# Patient Record
Sex: Female | Born: 1935 | Race: Black or African American | Hispanic: No | Smoking: Former smoker
Health system: Southern US, Community
[De-identification: ages and names within clinical notes are randomized; demographics above are authoritative.]

## PROBLEM LIST (undated history)

## (undated) DIAGNOSIS — G8191 Hemiplegia, unspecified affecting right dominant side: Secondary | ICD-10-CM

## (undated) DIAGNOSIS — Z993 Dependence on wheelchair: Secondary | ICD-10-CM

## (undated) DIAGNOSIS — I2510 Atherosclerotic heart disease of native coronary artery without angina pectoris: Secondary | ICD-10-CM

## (undated) DIAGNOSIS — I10 Essential (primary) hypertension: Secondary | ICD-10-CM

## (undated) DIAGNOSIS — M549 Dorsalgia, unspecified: Secondary | ICD-10-CM

## (undated) DIAGNOSIS — M1990 Unspecified osteoarthritis, unspecified site: Secondary | ICD-10-CM

## (undated) HISTORY — PX: ABDOMINAL HYSTERECTOMY: SHX81

## (undated) HISTORY — PX: CARDIAC SURGERY: SHX584

---

## 2017-09-17 ENCOUNTER — Emergency Department (HOSPITAL_COMMUNITY): Payer: Medicare Other

## 2017-09-17 ENCOUNTER — Encounter (HOSPITAL_COMMUNITY): Payer: Self-pay | Admitting: Emergency Medicine

## 2017-09-17 ENCOUNTER — Inpatient Hospital Stay (HOSPITAL_COMMUNITY)
Admission: EM | Admit: 2017-09-17 | Discharge: 2017-09-20 | DRG: 193 | Disposition: A | Discharge status: Home / Self Care | Payer: Medicare Other | Attending: Internal Medicine | Admitting: Internal Medicine

## 2017-09-17 ENCOUNTER — Other Ambulatory Visit: Payer: Self-pay

## 2017-09-17 DIAGNOSIS — G9340 Encephalopathy, unspecified: Secondary | ICD-10-CM

## 2017-09-17 DIAGNOSIS — T402X1A Poisoning by other opioids, accidental (unintentional), initial encounter: Secondary | ICD-10-CM | POA: Diagnosis not present

## 2017-09-17 DIAGNOSIS — Z8249 Family history of ischemic heart disease and other diseases of the circulatory system: Secondary | ICD-10-CM

## 2017-09-17 DIAGNOSIS — E872 Acidosis: Secondary | ICD-10-CM | POA: Diagnosis present

## 2017-09-17 DIAGNOSIS — G9341 Metabolic encephalopathy: Secondary | ICD-10-CM | POA: Diagnosis present

## 2017-09-17 DIAGNOSIS — Z79891 Long term (current) use of opiate analgesic: Secondary | ICD-10-CM | POA: Diagnosis not present

## 2017-09-17 DIAGNOSIS — N179 Acute kidney failure, unspecified: Secondary | ICD-10-CM

## 2017-09-17 DIAGNOSIS — I129 Hypertensive chronic kidney disease with stage 1 through stage 4 chronic kidney disease, or unspecified chronic kidney disease: Secondary | ICD-10-CM | POA: Diagnosis present

## 2017-09-17 DIAGNOSIS — M4800 Spinal stenosis, site unspecified: Secondary | ICD-10-CM | POA: Diagnosis present

## 2017-09-17 DIAGNOSIS — I2510 Atherosclerotic heart disease of native coronary artery without angina pectoris: Secondary | ICD-10-CM | POA: Diagnosis present

## 2017-09-17 DIAGNOSIS — E860 Dehydration: Secondary | ICD-10-CM | POA: Diagnosis present

## 2017-09-17 DIAGNOSIS — E875 Hyperkalemia: Secondary | ICD-10-CM | POA: Diagnosis present

## 2017-09-17 DIAGNOSIS — J188 Other pneumonia, unspecified organism: Secondary | ICD-10-CM

## 2017-09-17 DIAGNOSIS — E871 Hypo-osmolality and hyponatremia: Secondary | ICD-10-CM | POA: Diagnosis present

## 2017-09-17 DIAGNOSIS — J189 Pneumonia, unspecified organism: Secondary | ICD-10-CM | POA: Diagnosis not present

## 2017-09-17 DIAGNOSIS — Z955 Presence of coronary angioplasty implant and graft: Secondary | ICD-10-CM

## 2017-09-17 DIAGNOSIS — N183 Chronic kidney disease, stage 3 (moderate): Secondary | ICD-10-CM | POA: Diagnosis present

## 2017-09-17 DIAGNOSIS — J181 Lobar pneumonia, unspecified organism: Principal | ICD-10-CM | POA: Diagnosis present

## 2017-09-17 DIAGNOSIS — Z87891 Personal history of nicotine dependence: Secondary | ICD-10-CM

## 2017-09-17 DIAGNOSIS — R0902 Hypoxemia: Secondary | ICD-10-CM | POA: Diagnosis present

## 2017-09-17 DIAGNOSIS — Z993 Dependence on wheelchair: Secondary | ICD-10-CM

## 2017-09-17 HISTORY — DX: Atherosclerotic heart disease of native coronary artery without angina pectoris: I25.10

## 2017-09-17 HISTORY — DX: Essential (primary) hypertension: I10

## 2017-09-17 HISTORY — DX: Unspecified osteoarthritis, unspecified site: M19.90

## 2017-09-17 HISTORY — DX: Dorsalgia, unspecified: M54.9

## 2017-09-17 LAB — CBC
HCT: 35.2 % — ABNORMAL LOW (ref 36.0–46.0)
Hemoglobin: 11.5 g/dL — ABNORMAL LOW (ref 12.0–15.0)
MCH: 27.6 pg (ref 26.0–34.0)
MCHC: 32.7 g/dL (ref 30.0–36.0)
MCV: 84.6 fL (ref 78.0–100.0)
Platelets: 159 10*3/uL (ref 150–400)
RBC: 4.16 MIL/uL (ref 3.87–5.11)
RDW: 15.3 % (ref 11.5–15.5)
WBC: 11.4 10*3/uL — ABNORMAL HIGH (ref 4.0–10.5)

## 2017-09-17 LAB — COMPREHENSIVE METABOLIC PANEL
ALK PHOS: 101 U/L (ref 38–126)
ALT: 8 U/L — AB (ref 14–54)
AST: 28 U/L (ref 15–41)
Albumin: 3.2 g/dL — ABNORMAL LOW (ref 3.5–5.0)
Anion gap: 9 (ref 5–15)
BILIRUBIN TOTAL: 0.6 mg/dL (ref 0.3–1.2)
BUN: 27 mg/dL — AB (ref 6–20)
CALCIUM: 8.3 mg/dL — AB (ref 8.9–10.3)
CO2: 22 mmol/L (ref 22–32)
CREATININE: 2.05 mg/dL — AB (ref 0.44–1.00)
Chloride: 95 mmol/L — ABNORMAL LOW (ref 101–111)
GFR calc Af Amer: 25 mL/min — ABNORMAL LOW (ref >60)
GFR calc non Af Amer: 21 mL/min — ABNORMAL LOW (ref >60)
GLUCOSE: 105 mg/dL — AB (ref 65–99)
Potassium: 5.5 mmol/L — ABNORMAL HIGH (ref 3.5–5.1)
Sodium: 126 mmol/L — ABNORMAL LOW (ref 135–145)
TOTAL PROTEIN: 7.1 g/dL (ref 6.5–8.1)

## 2017-09-17 LAB — BASIC METABOLIC PANEL
Anion gap: 14 (ref 5–15)
BUN: 28 mg/dL — AB (ref 6–20)
CO2: 20 mmol/L — AB (ref 22–32)
Calcium: 8.6 mg/dL — ABNORMAL LOW (ref 8.9–10.3)
Chloride: 93 mmol/L — ABNORMAL LOW (ref 101–111)
Creatinine, Ser: 2.03 mg/dL — ABNORMAL HIGH (ref 0.44–1.00)
GFR calc Af Amer: 25 mL/min — ABNORMAL LOW (ref >60)
GFR, EST NON AFRICAN AMERICAN: 22 mL/min — AB (ref >60)
GLUCOSE: 101 mg/dL — AB (ref 65–99)
POTASSIUM: 5.3 mmol/L — AB (ref 3.5–5.1)
Sodium: 127 mmol/L — ABNORMAL LOW (ref 135–145)

## 2017-09-17 LAB — I-STAT CHEM 8, ED
BUN: 36 mg/dL — AB (ref 6–20)
CREATININE: 2.2 mg/dL — AB (ref 0.44–1.00)
Calcium, Ion: 1.05 mmol/L — ABNORMAL LOW (ref 1.15–1.40)
Chloride: 97 mmol/L — ABNORMAL LOW (ref 101–111)
GLUCOSE: 101 mg/dL — AB (ref 65–99)
HCT: 37 % (ref 36.0–46.0)
Hemoglobin: 12.6 g/dL (ref 12.0–15.0)
Potassium: 5.5 mmol/L — ABNORMAL HIGH (ref 3.5–5.1)
Sodium: 128 mmol/L — ABNORMAL LOW (ref 135–145)
TCO2: 24 mmol/L (ref 22–32)

## 2017-09-17 LAB — I-STAT TROPONIN, ED: Troponin i, poc: 0 ng/mL (ref 0.00–0.08)

## 2017-09-17 LAB — CBG MONITORING, ED: Glucose-Capillary: 94 mg/dL (ref 65–99)

## 2017-09-17 LAB — ETHANOL

## 2017-09-17 MED ORDER — ENOXAPARIN SODIUM 30 MG/0.3ML ~~LOC~~ SOLN
30.0000 mg | SUBCUTANEOUS | Status: DC
  Administered 2017-09-18 – 2017-09-19 (×2): 30 mg via SUBCUTANEOUS
  Filled 2017-09-17 (×3): qty 0.3

## 2017-09-17 MED ORDER — SODIUM CHLORIDE 0.9 % IV SOLN
500.0000 mg | INTRAVENOUS | Status: DC
  Administered 2017-09-19 – 2017-09-20 (×2): 500 mg via INTRAVENOUS
  Filled 2017-09-17 (×2): qty 500

## 2017-09-17 MED ORDER — SODIUM CHLORIDE 0.9 % IV SOLN
2.0000 g | Freq: Once | INTRAVENOUS | Status: AC
  Administered 2017-09-18: 2 g via INTRAVENOUS
  Filled 2017-09-17: qty 20

## 2017-09-17 MED ORDER — SODIUM CHLORIDE 0.9 % IV SOLN
Freq: Once | INTRAVENOUS | Status: AC
  Administered 2017-09-17: 23:00:00 via INTRAVENOUS

## 2017-09-17 MED ORDER — SODIUM CHLORIDE 0.9 % IV SOLN
1.0000 g | INTRAVENOUS | Status: DC
  Administered 2017-09-18 – 2017-09-20 (×2): 1 g via INTRAVENOUS
  Filled 2017-09-17 (×3): qty 10

## 2017-09-17 MED ORDER — SODIUM CHLORIDE 0.9 % IV SOLN
INTRAVENOUS | Status: AC
  Administered 2017-09-18: via INTRAVENOUS

## 2017-09-17 MED ORDER — NALOXONE HCL 0.4 MG/ML IJ SOLN
0.4000 mg | Freq: Once | INTRAMUSCULAR | Status: AC
  Administered 2017-09-17: 0.4 mg via INTRAVENOUS
  Filled 2017-09-17: qty 1

## 2017-09-17 MED ORDER — AZITHROMYCIN 500 MG IV SOLR
500.0000 mg | Freq: Once | INTRAVENOUS | Status: AC
  Administered 2017-09-18: 500 mg via INTRAVENOUS
  Filled 2017-09-17: qty 500

## 2017-09-17 NOTE — ED Notes (Signed)
Positive response to narcan.  EDP at bedside speaking to daughter and son on the phone.

## 2017-09-17 NOTE — ED Triage Notes (Signed)
Pt arrives via [facility] reporting lethargy and generalized weakness worsening since yesterday.  Pt drove up yesterday from [place], [place], approx 3-4 hr drive.  Pt denies pain, family reports no complaints.  Pt wearing fentanyl patch on arrival.  Pt's family reports pt on same dose for years.  Resp e/u. CBG on arrival [id number].

## 2017-09-17 NOTE — H&P (Addendum)
TRH H&P   Patient Demographics:    Lahoma RockerLillie Stoltzfus, is a 84 y.o. female  MRN: [id number]   DOB - 08-02-1932  Admit Date - 09/17/2017  Outpatient Primary MD for the patient is Patient, No Pcp Per  Referring MD/NP/PA: Reinaldo Meekerameron Issacs  Outpatient Specialists:     Patient coming from: home  Chief Complaint  Patient presents with  . Altered Mental Status  . Weakness      HPI:    Lahoma RockerLillie Jaquith  is a 84 y.o. female, w hypertension, CAD s/p stent presents with c/o AMS,  Starting yesterday evening.  Persisted today.  Pt denies fever, chills, cough, cp, palp, sob, n/v, diarrhea.   Pt was brought in for evaluation by her family.    In ED , CXR IMPRESSION: 1. Multifocal ground-glass opacity in the right upper lobe and left base suspicious for multifocal pneumonia 2. Asymmetric right hilar opacity, cannot exclude mass. Recommend contrast-enhanced CT to further evaluate 3. Tiny pleural effusions.   CT brain IMPRESSION: 1. No CT evidence for acute intracranial abnormality. 2. Small vessel ischemic changes of the white matter.  Na 127, K 5.3, Bun 28, Creatinine 2.03,  Wbc 11.4, Hgb 11.5 Plt 159 Trop 0.00  Pt will be admitted for AMS secondary to pneumonia, ARF, and hyperkalemia.    Review of systems:    In addition to the HPI above,  No Fever-chills, No Headache, No changes with Vision or hearing, No problems swallowing food or Liquids, No Chest pain, Cough or Shortness of Breath, No Abdominal pain, No Nausea or Vommitting, Bowel movements are regular, No Blood in stool or Urine, No dysuria, No new skin rashes or bruises, No new joints pains-aches,  No new weakness, tingling, numbness in any extremity, No recent weight gain or loss, No polyuria, polydypsia or polyphagia, No significant Mental Stressors.  A full 10 point Review of Systems was done, except as stated  above, all other Review of Systems were negative.   With Past History of the following :    Past Medical History:  Diagnosis Date  . Arthritis   . Coronary artery disease   . Hypertension       Past Surgical History:  Procedure Laterality Date  . ABDOMINAL HYSTERECTOMY    . CARDIAC SURGERY     stent placement      Social History:     Social History   Tobacco Use  . Smoking status: Former Games developermoker  . Smokeless tobacco: Never Used  Substance Use Topics  . Alcohol use: Never    Frequency: Never     Lives - at home  Mobility - walks by self   Family History :     Family History  Problem Relation Age of Onset  . Hypertension Mother       Home Medications:   Prior to Admission medications   Not on File  Allergies:    No Known Allergies   Physical Exam:   Vitals  Blood pressure 104/78, pulse 71, temperature 98.8 F (37.1 C), temperature source Oral, resp. rate (!) 8, height 5\' 4"  (1.626 m), weight 74.8 kg (165 lb), SpO2 95 %.   1. General  lying in bed in NAD,    2. Normal affect and insight, Not Suicidal or Homicidal, Awake Alert, Oriented X 3.  3. No F.N deficits, ALL C.Nerves Intact, Strength 5/5 all 4 extremities, Sensation intact all 4 extremities, Plantars down going.  4. Ears and Eyes appear Normal, Conjunctivae clear, PERRLA. Moist Oral Mucosa.  5. Supple Neck, No JVD, No cervical lymphadenopathy appriciated, No Carotid Bruits.  6. Symmetrical Chest wall movement, Good air movement bilaterally, slight crackles bilateral base, no wheezing.   7. RRR, No Gallops, Rubs or Murmurs, No Parasternal Heave.  8. Positive Bowel Sounds, Abdomen Soft, No tenderness, No organomegaly appriciated,No rebound -guarding or rigidity.  9.  No Cyanosis, Normal Skin Turgor, No Skin Rash or Bruise.  10. Good muscle tone,  joints appear normal , no effusions, Normal ROM.  11. No Palpable Lymph Nodes in Neck or Axillae     Data Review:    CBC Recent  Labs  Lab 09/17/17 2104 09/17/17 2118  WBC 11.4*  --   HGB 11.5* 12.6  HCT 35.2* 37.0  PLT 159  --   MCV 84.6  --   MCH 27.6  --   MCHC 32.7  --   RDW 15.3  --    ------------------------------------------------------------------------------------------------------------------  Chemistries  Recent Labs  Lab 09/17/17 2104 09/17/17 2118 09/17/17 2225  NA 126* 128* 127*  K 5.5* 5.5* 5.3*  CL 95* 97* 93*  CO2 22  --  20*  GLUCOSE 105* 101* 101*  BUN 27* 36* 28*  CREATININE 2.05* 2.20* 2.03*  CALCIUM 8.3*  --  8.6*  AST 28  --   --   ALT 8*  --   --   ALKPHOS 101  --   --   BILITOT 0.6  --   --    ------------------------------------------------------------------------------------------------------------------ estimated creatinine clearance is 21.1 mL/min (A) (by C-G formula based on SCr of 2.03 mg/dL (H)). ------------------------------------------------------------------------------------------------------------------ No results for input(s): TSH, T4TOTAL, T3FREE, THYROIDAB in the last 72 hours.  Invalid input(s): FREET3  Coagulation profile No results for input(s): INR, PROTIME in the last 168 hours. ------------------------------------------------------------------------------------------------------------------- No results for input(s): DDIMER in the last 72 hours. -------------------------------------------------------------------------------------------------------------------  Cardiac Enzymes No results for input(s): CKMB, TROPONINI, MYOGLOBIN in the last 168 hours.  Invalid input(s): CK ------------------------------------------------------------------------------------------------------------------ No results found for: BNP   ---------------------------------------------------------------------------------------------------------------  Urinalysis No results found for: COLORURINE, APPEARANCEUR, LABSPEC, PHURINE, GLUCOSEU, HGBUR, BILIRUBINUR, KETONESUR,  PROTEINUR, UROBILINOGEN, NITRITE, LEUKOCYTESUR  ----------------------------------------------------------------------------------------------------------------   Imaging Results:    Dg Chest 2 View  Result Date: 09/17/2017 CLINICAL DATA:  Confusion EXAM: CHEST - 2 VIEW COMPARISON:  None. FINDINGS: Low lung volumes. Multifocal ground-glass opacity in the right upper lobe and left base suspicious for multifocal pneumonia. Tiny pleural effusions. Heart size within normal limits. Aortic atherosclerosis. Asymmetric right hilar opacity. IMPRESSION: 1. Multifocal ground-glass opacity in the right upper lobe and left base suspicious for multifocal pneumonia 2. Asymmetric right hilar opacity, cannot exclude mass. Recommend contrast-enhanced CT to further evaluate 3. Tiny pleural effusions. Electronically Signed   By: Jasmine Pang M.D.   On: 09/17/2017 22:18   Ct Head Wo Contrast  Result Date: 09/17/2017 CLINICAL DATA:  Altered LOC EXAM: CT HEAD WITHOUT  CONTRAST TECHNIQUE: Contiguous axial images were obtained from the base of the skull through the vertex without intravenous contrast. COMPARISON:  None. FINDINGS: Brain: No acute territorial infarction, hemorrhage or intracranial mass is visualized. Mild small vessel ischemic changes of the white matter. Ventricle size within normal limits. Vascular: No hyperdense vessels.  Carotid vascular calcification. Skull: Normal. Negative for fracture or focal lesion. Sinuses/Orbits: Mild mucosal thickening in the ethmoid sinuses. No acute orbital abnormality. Other: None IMPRESSION: 1. No CT evidence for acute intracranial abnormality. 2. Small vessel ischemic changes of the white matter. Electronically Signed   By: Jasmine Pang M.D.   On: 09/17/2017 22:33       Assessment & Plan:    Active Problems:   Community acquired pneumonia   CAP (community acquired pneumonia)    CAP  Blood culture x2 Urine legionella antigen Urine strep antigen Sputum gram stain  and culture Start on Rocephin 1gm iv qday, zithromax 500mg  iv qday Check cbc in am CT chest pending => please f/u on results  Hyponatremia Check serum osm, tsh, cortisol Check urine osm, urine sodium Hydrate with ns iv  Check cmp in am  Hyperkalemia Hydrate with ns iv Check cmp in am  ARF Check urine sodium, urine creatinine, urine eosinophils Check renal ultrasound Check cmp in am   DVT Prophylaxis Lovenox - SCDs  AM Labs Ordered, also please review Full Orders  Family Communication: Admission, patients condition and plan of care including tests being ordered have been discussed with the patient  who indicate understanding and agree with the plan and Code Status.  Code Status FULL CODE  Likely DC to  home  Condition GUARDED    Consults called:  none  Admission status: inpatient  Time spent in minutes : 45   Pearson Grippe M.D on 09/17/2017 at 11:34 PM  Between 7am to 7pm - Pager - [phone]   After 7pm go to [email or web address] - password [facility]  [facility] - Office  [phone]

## 2017-09-17 NOTE — ED Provider Notes (Signed)
[facility] EMERGENCY DEPARTMENT Provider Note   CSN: [id number] Arrival date & time: 09/17/17  2058     History   Chief Complaint Chief Complaint  Patient presents with  . Altered Mental Status  . Weakness    HPI Katherine Aleshire is a 84 y.o. female.  HPI   84 yo F with PMHx HTN, CAD, here with generalized weakness and AMS. History limited 2/2 AMS. Pt arrives with daughter. Pt and daughter currenlty are travelling through [place] - pt normally lives in [place]. She reportedly was well yesterday, slightly tired but this is not abnormal. They drove to [place] and rented a hotel. On awakening, pt was confused, drowsy, and had a hard time wakign up. She was "okay" x 2 hours then was falling asleep, drowsy, with slurred speech. She's had occasional coughing as well. She was altered throughout the day today so subsequently brought to the ED.   On arrival here, pt with slurred speech, fatigued. Level 5 caveat invoked as remainder of history, ROS, and physical exam limited due to patient's confusion.   Past Medical History:  Diagnosis Date  . Arthritis   . Coronary artery disease   . Hypertension     There are no active problems to display for this patient.   Past Surgical History:  Procedure Laterality Date  . ABDOMINAL HYSTERECTOMY    . CARDIAC SURGERY     stent placement     OB History   None      Home Medications    Prior to Admission medications   Not on File    Family History No family history on file.  Social History Social History   Tobacco Use  . Smoking status: Former Games developer  . Smokeless tobacco: Never Used  Substance Use Topics  . Alcohol use: Never    Frequency: Never  . Drug use: Never     Allergies   Patient has no known allergies.   Review of Systems Review of Systems  Unable to perform ROS: Mental status change  Constitutional: Positive for fatigue.     Physical Exam Updated Vital Signs BP 104/78   Pulse  71   Temp 98.8 F (37.1 C) (Oral)   Resp (!) 8   Ht 5\' 4"  (1.626 m)   Wt 74.8 kg (165 lb)   SpO2 95%   BMI 28.32 kg/m   Physical Exam  Constitutional: She appears well-developed and well-nourished. She appears lethargic. No distress.  HENT:  Head: Normocephalic and atraumatic.  Eyes: Conjunctivae are normal.  Pinpoint pupils bilaterally  Neck: Neck supple.  Cardiovascular: Normal rate, regular rhythm and normal heart sounds. Exam reveals no friction rub.  No murmur heard. Pulmonary/Chest: Effort normal. Bradypnea noted. No respiratory distress. She has no wheezes. She has rhonchi in the right middle field, the right lower field, the left middle field and the left lower field. She has no rales.  Abdominal: She exhibits no distension.  Musculoskeletal: She exhibits no edema.  Neurological: She appears lethargic. She exhibits normal muscle tone. GCS eye subscore is 4. GCS verbal subscore is 5. GCS motor subscore is 6.  Drowsy but awakens, follows commands. MAE with 5/5 strength. Speech slightly slurred.  Skin: Skin is warm. Capillary refill takes less than 2 seconds.  Psychiatric: She has a normal mood and affect.  Nursing note and vitals reviewed.    ED Treatments / Results  Labs (all labs ordered are listed, but only abnormal results are displayed) Labs Reviewed  COMPREHENSIVE METABOLIC PANEL - Abnormal; Notable for the following components:      Result Value   Sodium 126 (*)    Potassium 5.5 (*)    Chloride 95 (*)    Glucose, Bld 105 (*)    BUN 27 (*)    Creatinine, Ser 2.05 (*)    Calcium 8.3 (*)    Albumin 3.2 (*)    ALT 8 (*)    GFR calc non Af Amer 21 (*)    GFR calc Af Amer 25 (*)    All other components within normal limits  CBC - Abnormal; Notable for the following components:   WBC 11.4 (*)    Hemoglobin 11.5 (*)    HCT 35.2 (*)    All other components within normal limits  BASIC METABOLIC PANEL - Abnormal; Notable for the following components:   Sodium  127 (*)    Potassium 5.3 (*)    Chloride 93 (*)    CO2 20 (*)    Glucose, Bld 101 (*)    BUN 28 (*)    Creatinine, Ser 2.03 (*)    Calcium 8.6 (*)    GFR calc non Af Amer 22 (*)    GFR calc Af Amer 25 (*)    All other components within normal limits  I-STAT CHEM 8, ED - Abnormal; Notable for the following components:   Sodium 128 (*)    Potassium 5.5 (*)    Chloride 97 (*)    BUN 36 (*)    Creatinine, Ser 2.20 (*)    Glucose, Bld 101 (*)    Calcium, Ion 1.05 (*)    All other components within normal limits  CULTURE, BLOOD (ROUTINE X 2)  CULTURE, BLOOD (ROUTINE X 2)  ETHANOL  URINALYSIS, ROUTINE W REFLEX MICROSCOPIC  CBG MONITORING, ED  CBG MONITORING, ED  I-STAT TROPONIN, ED  I-STAT VENOUS BLOOD GAS, ED    EKG None  Radiology Dg Chest 2 View  Result Date: 09/17/2017 CLINICAL DATA:  Confusion EXAM: CHEST - 2 VIEW COMPARISON:  None. FINDINGS: Low lung volumes. Multifocal ground-glass opacity in the right upper lobe and left base suspicious for multifocal pneumonia. Tiny pleural effusions. Heart size within normal limits. Aortic atherosclerosis. Asymmetric right hilar opacity. IMPRESSION: 1. Multifocal ground-glass opacity in the right upper lobe and left base suspicious for multifocal pneumonia 2. Asymmetric right hilar opacity, cannot exclude mass. Recommend contrast-enhanced CT to further evaluate 3. Tiny pleural effusions. Electronically Signed   By: Jasmine Pang M.D.   On: 09/17/2017 22:18   Ct Head Wo Contrast  Result Date: 09/17/2017 CLINICAL DATA:  Altered LOC EXAM: CT HEAD WITHOUT CONTRAST TECHNIQUE: Contiguous axial images were obtained from the base of the skull through the vertex without intravenous contrast. COMPARISON:  None. FINDINGS: Brain: No acute territorial infarction, hemorrhage or intracranial mass is visualized. Mild small vessel ischemic changes of the white matter. Ventricle size within normal limits. Vascular: No hyperdense vessels.  Carotid vascular  calcification. Skull: Normal. Negative for fracture or focal lesion. Sinuses/Orbits: Mild mucosal thickening in the ethmoid sinuses. No acute orbital abnormality. Other: None IMPRESSION: 1. No CT evidence for acute intracranial abnormality. 2. Small vessel ischemic changes of the white matter. Electronically Signed   By: Jasmine Pang M.D.   On: 09/17/2017 22:33    Procedures Procedures (including critical care time)  Medications Ordered in ED Medications  cefTRIAXone (ROCEPHIN) 2 g in sodium chloride 0.9 % 100 mL IVPB (has no administration in time range)  azithromycin (ZITHROMAX) 500 mg in sodium chloride 0.9 % 250 mL IVPB (has no administration in time range)  naloxone [facility](NARCAN) injection 0.4 mg (0.4 mg Intravenous Given 09/17/17 2236)  0.9 %  sodium chloride infusion ( Intravenous New Bag/Given 09/17/17 2236)     Initial Impression / Assessment and Plan / ED Course  I have reviewed the triage vital signs and the nursing notes.  Pertinent labs & imaging results that were available during my care of the patient were reviewed by me and considered in my medical decision making (see chart for details).     84 yo F here with AMS, lethargy. On arrival, pt drowsy but aorusable, protecting her airway. Initial work-up shows multifocal PNA, likely AKI - unknown baseline but family denies any known h/o CKD. I suspect her drowsiness is multifactorial 2/2 PNA, also AKI with retention of her chronic opioids and subsequent opioid narcosis. Pt given dose of narcan with excellent response. Will plan to admit for encephalopathy 2/2 PNA, opioid use.  Final Clinical Impressions(s) / ED Diagnoses   Final diagnoses:  Multifocal pneumonia  Opioid overdose, accidental or unintentional, initial encounter [facility](HCC)  Encephalopathy    ED Discharge Orders    None       Shaune PollackIsaacs, Jaleigha Deane, MD 09/17/17 2259

## 2017-09-18 ENCOUNTER — Inpatient Hospital Stay (HOSPITAL_COMMUNITY): Payer: Medicare Other

## 2017-09-18 ENCOUNTER — Other Ambulatory Visit: Payer: Self-pay

## 2017-09-18 DIAGNOSIS — J181 Lobar pneumonia, unspecified organism: Principal | ICD-10-CM

## 2017-09-18 LAB — I-STAT ARTERIAL BLOOD GAS, ED
Acid-base deficit: 5 mmol/L — ABNORMAL HIGH (ref 0.0–2.0)
Bicarbonate: 21.1 mmol/L (ref 20.0–28.0)
O2 SAT: 97 %
PCO2 ART: 42.2 mmHg (ref 32.0–48.0)
PH ART: 7.308 — AB (ref 7.350–7.450)
TCO2: 22 mmol/L (ref 22–32)
pO2, Arterial: 97 mmHg (ref 83.0–108.0)

## 2017-09-18 LAB — TSH: TSH: 1.313 u[IU]/mL (ref 0.350–4.500)

## 2017-09-18 LAB — I-STAT VENOUS BLOOD GAS, ED
Acid-base deficit: 3 mmol/L — ABNORMAL HIGH (ref 0.0–2.0)
Bicarbonate: 24.5 mmol/L (ref 20.0–28.0)
O2 SAT: 24 %
PH VEN: 7.262 (ref 7.250–7.430)
PO2 VEN: 20 mmHg — AB (ref 32.0–45.0)
TCO2: 26 mmol/L (ref 22–32)
pCO2, Ven: 54.2 mmHg (ref 44.0–60.0)

## 2017-09-18 LAB — URINALYSIS, ROUTINE W REFLEX MICROSCOPIC
Bilirubin Urine: NEGATIVE
Glucose, UA: NEGATIVE mg/dL
Ketones, ur: NEGATIVE mg/dL
Nitrite: NEGATIVE
PROTEIN: NEGATIVE mg/dL
Specific Gravity, Urine: 1.006 (ref 1.005–1.030)
pH: 6 (ref 5.0–8.0)

## 2017-09-18 LAB — CORTISOL: Cortisol, Plasma: 26.7 ug/dL

## 2017-09-18 LAB — OSMOLALITY: OSMOLALITY: 281 mosm/kg (ref 275–295)

## 2017-09-18 LAB — HIV ANTIBODY (ROUTINE TESTING W REFLEX): HIV Screen 4th Generation wRfx: NONREACTIVE

## 2017-09-18 LAB — OSMOLALITY, URINE: Osmolality, Ur: 225 mOsm/kg — ABNORMAL LOW (ref 300–900)

## 2017-09-18 LAB — STREP PNEUMONIAE URINARY ANTIGEN: Strep Pneumo Urinary Antigen: NEGATIVE

## 2017-09-18 LAB — SODIUM, URINE, RANDOM: SODIUM UR: 52 mmol/L

## 2017-09-18 NOTE — Progress Notes (Signed)
PROGRESS NOTE  Katherine Burns JXB:[id number] DOB: Nov 08, 1932 DOA: 09/17/2017 PCP: Patient, No Pcp Per  HPI/Recap of past 24 hours: Katherine Burns  is a 84 y.o. female, w hypertension, CAD s/p stent presents with c/o AMS,  Starting yesterday evening.  Persisted today.  Pt denies fever, chills, cough, cp, palp, sob, n/v, diarrhea.   Pt was brought in for evaluation by her family.    09/18/2017: Patient seen and examined at her bedside. She reports productive cough for more than a week.  He denies dyspnea at rest or chest pain.   Assessment/Plan:Active Problems:   Community acquired pneumonia   Multifocal pneumonia  Multifocal community-acquired pneumonia with suspected aspiration, not improved Personally reviewed chest x-ray done on 09/17/2017 bilateral patchy infiltrates noted. Continue IV ceftriaxone and IV azithromycin day #1 On CT chest with no contrast small volume of retained or aspirated secretions in the trachea at the thoracic inlet Continue O2 supplementation to maintain O2 saturation 92% or greater Continue continuous pulse oximetry Continue nebs We will obtain swallow evaluation to rule out dysphagia Closely monitor vital signs Aspiration precaution Blood cultures in process  Suspected dysphagia On CT chest with no contrast done on 09/17/2017 noted small volume of retained or aspirated secretions in the trachea at the thoracic inlet. Speech therapist to evaluate for safety swallowing  Acute metabolic encephalopathy, resolved Most likely secondary to pneumonia versus hyponatremia CT head negative for intracranial findings Reorient as needed  Hyponatremia Sodium 127 potassium 5.3 Repeat BMP in the morning If persistent may consider consulting nephrology  Anion gap metabolic acidosis Bicarb 20 anion gap 14 Unclear etiology  CKD stage III No baseline creatinine to compare with Creatinine 2.03 from 2.20 Avoid nephrotoxic agents/hypotension Repeat BMP in the  morning.   Code Status: Full code  Family Communication: None at bedside  Disposition Plan: SNF versus home when clinically stable     Consultants: None  Procedures:  None  Antimicrobials:  IV azithromycin and IV ceftriaxone  DVT prophylaxis:  SCDs   Objective: Vitals:   09/18/17 0915 09/18/17 1007 09/18/17 1131 09/18/17 1521  BP: 103/64  (!) 143/83 138/77  Pulse: 77 74 80 85  Resp: (!) 24 14 16 18   Temp:   98.9 F (37.2 C) 99.5 F (37.5 C)  TempSrc:   Oral Oral  SpO2: 100% 100% 96% (!) 87%  Weight:   73.5 kg (162 lb)   Height:   5\' 4"  (1.626 m)     Intake/Output Summary (Last 24 hours) at 09/18/2017 1614 Last data filed at 09/18/2017 1458 Gross per 24 hour  Intake 1147.5 ml  Output -  Net 1147.5 ml   Filed Weights   09/17/17 2108 09/18/17 1131  Weight: 74.8 kg (165 lb) 73.5 kg (162 lb)    Exam:   General: 84 year old African American female well-developed well-nourished no acute distress.  Alert and oriented x3.  Cardiovascular: Regular rate and rhythm with no rubs or gallops.  Respiratory: Mild rales at bases.  No wheezes   abdomen: Soft nontender nondistended normal bowel sounds x4  Musculoskeletal: No motor focal deficits  Skin: No rash noted  Psychiatry: Mood is appropriate for condition and setting   Data Reviewed: CBC: Recent Labs  Lab 09/17/17 2104 09/17/17 2118  WBC 11.4*  --   HGB 11.5* 12.6  HCT 35.2* 37.0  MCV 84.6  --   PLT 159  --    Basic Metabolic Panel: Recent Labs  Lab 09/17/17 2104 09/17/17 2118 09/17/17 2225  NA  126* 128* 127*  K 5.5* 5.5* 5.3*  CL 95* 97* 93*  CO2 22  --  20*  GLUCOSE 105* 101* 101*  BUN 27* 36* 28*  CREATININE 2.05* 2.20* 2.03*  CALCIUM 8.3*  --  8.6*   GFR: Estimated Creatinine Clearance: 21 mL/min (A) (by C-G formula based on SCr of 2.03 mg/dL (H)). Liver Function Tests: Recent Labs  Lab 09/17/17 2104  AST 28  ALT 8*  ALKPHOS 101  BILITOT 0.6  PROT 7.1  ALBUMIN 3.2*    No results for input(s): LIPASE, AMYLASE in the last 168 hours. No results for input(s): AMMONIA in the last 168 hours. Coagulation Profile: No results for input(s): INR, PROTIME in the last 168 hours. Cardiac Enzymes: No results for input(s): CKTOTAL, CKMB, CKMBINDEX, TROPONINI in the last 168 hours. BNP (last 3 results) No results for input(s): PROBNP in the last 8760 hours. HbA1C: No results for input(s): HGBA1C in the last 72 hours. CBG: Recent Labs  Lab 09/17/17 2103  GLUCAP 94   Lipid Profile: No results for input(s): CHOL, HDL, LDLCALC, TRIG, CHOLHDL, LDLDIRECT in the last 72 hours. Thyroid Function Tests: Recent Labs    09/18/17 0003  TSH 1.313   Anemia Panel: No results for input(s): VITAMINB12, FOLATE, FERRITIN, TIBC, IRON, RETICCTPCT in the last 72 hours. Urine analysis:    Component Value Date/Time   COLORURINE YELLOW 09/17/2017 2337   APPEARANCEUR HAZY (A) 09/17/2017 2337   LABSPEC 1.006 09/17/2017 2337   PHURINE 6.0 09/17/2017 2337   GLUCOSEU NEGATIVE 09/17/2017 2337   HGBUR SMALL (A) 09/17/2017 2337   BILIRUBINUR NEGATIVE 09/17/2017 2337   KETONESUR NEGATIVE 09/17/2017 2337   PROTEINUR NEGATIVE 09/17/2017 2337   NITRITE NEGATIVE 09/17/2017 2337   LEUKOCYTESUR MODERATE (A) 09/17/2017 2337   Sepsis Labs: @LABRCNTIP (procalcitonin:4,lacticidven:4)  )No results found for this or any previous visit (from the past 240 hour(s)).    Studies: Dg Chest 2 View  Result Date: 09/17/2017 CLINICAL DATA:  Confusion EXAM: CHEST - 2 VIEW COMPARISON:  None. FINDINGS: Low lung volumes. Multifocal ground-glass opacity in the right upper lobe and left base suspicious for multifocal pneumonia. Tiny pleural effusions. Heart size within normal limits. Aortic atherosclerosis. Asymmetric right hilar opacity. IMPRESSION: 1. Multifocal ground-glass opacity in the right upper lobe and left base suspicious for multifocal pneumonia 2. Asymmetric right hilar opacity, cannot exclude  mass. Recommend contrast-enhanced CT to further evaluate 3. Tiny pleural effusions. Electronically Signed   By: Jasmine Pang M.D.   On: 09/17/2017 22:18   Ct Head Wo Contrast  Result Date: 09/17/2017 CLINICAL DATA:  Altered LOC EXAM: CT HEAD WITHOUT CONTRAST TECHNIQUE: Contiguous axial images were obtained from the base of the skull through the vertex without intravenous contrast. COMPARISON:  None. FINDINGS: Brain: No acute territorial infarction, hemorrhage or intracranial mass is visualized. Mild small vessel ischemic changes of the white matter. Ventricle size within normal limits. Vascular: No hyperdense vessels.  Carotid vascular calcification. Skull: Normal. Negative for fracture or focal lesion. Sinuses/Orbits: Mild mucosal thickening in the ethmoid sinuses. No acute orbital abnormality. Other: None IMPRESSION: 1. No CT evidence for acute intracranial abnormality. 2. Small vessel ischemic changes of the white matter. Electronically Signed   By: Jasmine Pang M.D.   On: 09/17/2017 22:33   Ct Chest Wo Contrast  Result Date: 09/17/2017 CLINICAL DATA:  84 year old female with weakness and lethargy. Progressive symptoms since yesterday. EXAM: CT CHEST WITHOUT CONTRAST TECHNIQUE: Multidetector CT imaging of the chest was performed following the standard protocol  without IV contrast. COMPARISON:  Chest radiographs 1922 hours today. FINDINGS: Cardiovascular: Vascular patency is not evaluated in the absence of IV contrast. Tortuous and mildly ectatic thoracic aorta with mild calcified atherosclerosis. Calcified coronary artery atherosclerosis. Cardiac size is within normal limits. No pericardial effusion. Mediastinum/Nodes: Negative.  No mediastinal lymphadenopathy. Lungs/Pleura: Bilateral paraseptal emphysema, most pronounced in the right upper lobe. Intermittent respiratory motion artifact. Mild to moderate bilateral lower lobe costophrenic angle subpleural opacity which might reflect a combination of  atelectasis and scarring. No consolidation. There is a small volume of retained or aspirated secretions in the trachea at the thoracic inlet on series 8, image 19, but otherwise the major airways are patent. No pleural effusion. Upper Abdomen: Visible noncontrast upper abdominal viscera remarkable for mild diverticulosis at the splenic flexure and small low-density areas in the kidneys which are probably benign cysts. Musculoskeletal: Thoracic spine degeneration. No acute osseous abnormality identified. IMPRESSION: 1. Chronic lung disease with Emphysema (ICD10-J43.9). No convincing superimposed acute or inflammatory pulmonary findings. 2. Small volume retained or aspirated secretions in the trachea at the thoracic inlet. 3. Mild ectasia of the thoracic aorta. Calcified coronary artery and to a lesser extent aortic atherosclerosis. Electronically Signed   By: Odessa FlemingH  Alesia Oshields M.D.   On: 09/17/2017 23:51   [place] Renal  Result Date: 09/18/2017 CLINICAL DATA:  84 y/o  F; acute renal failure. EXAM: RENAL / URINARY TRACT ULTRASOUND COMPLETE COMPARISON:  None. FINDINGS: Right Kidney: Length: 10.5 cm. Normal echogenicity. Right kidney simple appearing cysts measuring 2.3 and 1.2 cm. No hydronephrosis. Left Kidney: Length: 10.1 cm. Echogenicity within normal limits. No mass or hydronephrosis visualized. Bladder: Mild bladder distention. IMPRESSION: 1. No acute process identified. 2. Simple right kidney cyst measuring up to 2.3 cm. 3. Mild bladder distention. Electronically Signed   By: Mitzi HansenLance  Furusawa-Stratton M.D.   On: 09/18/2017 01:45    Scheduled Meds: . enoxaparin (LOVENOX) injection  30 mg Subcutaneous Q24H    Continuous Infusions: . azithromycin    . cefTRIAXone (ROCEPHIN)  IV       LOS: 1 day     Darlin Droparole N Timmya Blazier, MD [facility] Pager [phone]  If 7PM-7AM, please contact night-coverage [email or web address] Password [email or web address] 09/18/2017, 4:14 PM

## 2017-09-19 LAB — CBC
HEMATOCRIT: 31.5 % — AB (ref 36.0–46.0)
Hemoglobin: 10.3 g/dL — ABNORMAL LOW (ref 12.0–15.0)
MCH: 27.2 pg (ref 26.0–34.0)
MCHC: 32.7 g/dL (ref 30.0–36.0)
MCV: 83.1 fL (ref 78.0–100.0)
Platelets: 124 10*3/uL — ABNORMAL LOW (ref 150–400)
RBC: 3.79 MIL/uL — ABNORMAL LOW (ref 3.87–5.11)
RDW: 14.5 % (ref 11.5–15.5)
WBC: 5.9 10*3/uL (ref 4.0–10.5)

## 2017-09-19 LAB — LEGIONELLA PNEUMOPHILA SEROGP 1 UR AG: L. pneumophila Serogp 1 Ur Ag: NEGATIVE

## 2017-09-19 LAB — CALCIUM / CREATININE RATIO, URINE
CALCIUM UR: 1.5 mg/dL
Calcium/Creat.Ratio: 21 mg/g creat (ref 0–260)
Creatinine, Urine: 71.6 mg/dL

## 2017-09-19 LAB — BASIC METABOLIC PANEL
Anion gap: 7 (ref 5–15)
BUN: 19 mg/dL (ref 6–20)
CALCIUM: 8.9 mg/dL (ref 8.9–10.3)
CO2: 23 mmol/L (ref 22–32)
CREATININE: 1.17 mg/dL — AB (ref 0.44–1.00)
Chloride: 107 mmol/L (ref 101–111)
GFR calc Af Amer: 49 mL/min — ABNORMAL LOW (ref >60)
GFR calc non Af Amer: 42 mL/min — ABNORMAL LOW (ref >60)
GLUCOSE: 126 mg/dL — AB (ref 65–99)
Potassium: 4.3 mmol/L (ref 3.5–5.1)
Sodium: 137 mmol/L (ref 135–145)

## 2017-09-19 MED ORDER — PRAVASTATIN SODIUM 20 MG PO TABS
10.0000 mg | ORAL_TABLET | Freq: Every day | ORAL | Status: DC
  Administered 2017-09-19: 10 mg via ORAL
  Filled 2017-09-19: qty 1

## 2017-09-19 MED ORDER — IPRATROPIUM-ALBUTEROL 0.5-2.5 (3) MG/3ML IN SOLN
3.0000 mL | Freq: Four times a day (QID) | RESPIRATORY_TRACT | Status: DC
  Administered 2017-09-19 – 2017-09-20 (×3): 3 mL via RESPIRATORY_TRACT
  Filled 2017-09-19 (×3): qty 3

## 2017-09-19 MED ORDER — PANTOPRAZOLE SODIUM 40 MG PO TBEC
40.0000 mg | DELAYED_RELEASE_TABLET | Freq: Every day | ORAL | Status: DC
  Administered 2017-09-19 – 2017-09-20 (×2): 40 mg via ORAL
  Filled 2017-09-19 (×2): qty 1

## 2017-09-19 MED ORDER — SERTRALINE HCL 100 MG PO TABS
100.0000 mg | ORAL_TABLET | Freq: Two times a day (BID) | ORAL | Status: DC
  Administered 2017-09-19 – 2017-09-20 (×3): 100 mg via ORAL
  Filled 2017-09-19 (×3): qty 1

## 2017-09-19 MED ORDER — SENNOSIDES-DOCUSATE SODIUM 8.6-50 MG PO TABS
1.0000 | ORAL_TABLET | Freq: Two times a day (BID) | ORAL | Status: DC
  Administered 2017-09-19 – 2017-09-20 (×3): 1 via ORAL
  Filled 2017-09-19 (×3): qty 1

## 2017-09-19 MED ORDER — SODIUM CHLORIDE 3 % IN NEBU
4.0000 mL | INHALATION_SOLUTION | Freq: Three times a day (TID) | RESPIRATORY_TRACT | Status: DC
  Administered 2017-09-19: 4 mL via RESPIRATORY_TRACT
  Filled 2017-09-19 (×3): qty 4

## 2017-09-19 MED ORDER — ENOXAPARIN SODIUM 40 MG/0.4ML ~~LOC~~ SOLN
40.0000 mg | SUBCUTANEOUS | Status: DC
  Administered 2017-09-20: 40 mg via SUBCUTANEOUS
  Filled 2017-09-19: qty 0.4

## 2017-09-19 MED ORDER — SODIUM CHLORIDE 3 % IN NEBU
4.0000 mL | INHALATION_SOLUTION | Freq: Three times a day (TID) | RESPIRATORY_TRACT | Status: DC
  Administered 2017-09-19 – 2017-09-20 (×2): 4 mL via RESPIRATORY_TRACT
  Filled 2017-09-19 (×3): qty 4

## 2017-09-19 MED ORDER — AMLODIPINE BESYLATE 5 MG PO TABS
5.0000 mg | ORAL_TABLET | Freq: Every day | ORAL | Status: DC
Start: 2017-09-19 — End: 2017-09-20
  Administered 2017-09-19 – 2017-09-20 (×2): 5 mg via ORAL
  Filled 2017-09-19 (×2): qty 1

## 2017-09-19 MED ORDER — FENTANYL 50 MCG/HR TD PT72: 75.0000 ug | MEDICATED_PATCH | TRANSDERMAL | Status: DC

## 2017-09-19 MED ORDER — VITAMIN D (ERGOCALCIFEROL) 1.25 MG (50000 UNIT) PO CAPS
50000.0000 [IU] | ORAL_CAPSULE | ORAL | Status: DC
  Administered 2017-09-19: 50000 [IU] via ORAL
  Filled 2017-09-19: qty 1

## 2017-09-19 NOTE — Progress Notes (Signed)
PROGRESS NOTE  Katherine RockerLillie Laperle UXL:[id number] DOB: [birth year] DOA: 09/17/2017 PCP: Patient, No Pcp Per  HPI/Recap of past 24 hours: Katherine Burns  is a 84 y.o. female, w hypertension, CAD s/p stent presents with c/o AMS,  Starting yesterday evening.  Persisted today.  Pt denies fever, chills, cough, cp, palp, sob, n/v, diarrhea.   Pt was brought in for evaluation by her family.    09/18/2017: Patient seen and examined at her bedside. She reports productive cough for more than a week.  He denies dyspnea at rest or chest pain.  09/19/2017 patient seen and examined at her bedside. Her daughter is present.  Patient reports her cough is improving.   Assessment/Plan:Active Problems:   Community acquired pneumonia   Multifocal pneumonia  Multifocal community-acquired pneumonia with suspected aspiration, not improved Personally reviewed chest x-ray done on 09/17/2017 bilateral patchy infiltrates noted. Continue IV ceftriaxone and IV azithromycin Day number 2 out of 5 On CT chest with no contrast small volume of retained or aspirated secretions in the trachea at the thoracic inlet Continue O2 supplementation to maintain O2 saturation 92% or greater Continue continuous pulse oximetry Continue nebs We will obtain swallow evaluation to rule out dysphagia/she passes swallow evaluation by speech therapist this morning. Blood cultures in process  AK I on CKD 3 Creatinine on admission 2.20 Creatinine this morning 09/19/2017 is 1.17 Avoid nephrotoxic agents/hypotension/dehydration Repeat BMP in the morning  Suspected dysphagia, ruled out On CT chest with no contrast done on 09/17/2017 noted small volume of retained or aspirated secretions in the trachea at the thoracic inlet. Speech therapist to evaluate for safety swallowing She passed a swallow evaluation today and she is on a regular consistency diet with thin liquid  Acute metabolic encephalopathy, resolved Most likely secondary to pneumonia  versus hyponatremia versus dehydration in CT head negative for intracranial findings Reorient as needed  Hyponatremia/hyperkalemia, resolved Sodium 137 on 09/19/2017 from 127 Potassium 4.3 on 09/19/2017 from 5.3 Continue to monitor electrolytes  Anion gap metabolic acidosis, resolved Bicarb 23 and anion gap of 7 on 09/19/2017 Bicarb 20 anion gap 14 yesterday  Ambulatory dysfunction secondary to chronic spinal stenosis Holding off on patient's opiates.  She is alert and oriented x3.  She denies having any pain at this time. Patient is okay with holding off her opiates. PT to evaluate and treat   Code Status: Full code  Family Communication: None at bedside  Disposition Plan: SNF versus home when clinically stable     Consultants: None  Procedures:  None  Antimicrobials:  IV azithromycin and IV ceftriaxone  DVT prophylaxis:  SCDs   Objective: Vitals:   09/18/17 1605 09/18/17 2319 09/19/17 0947 09/19/17 1301  BP:  138/70 (!) 141/95   Pulse:  87 96   Resp:  18 18   Temp:  98.6 F (37 C) 99.5 F (37.5 C)   TempSrc:  Oral Oral   SpO2: 96% 99% 98% 98%  Weight:      Height:        Intake/Output Summary (Last 24 hours) at 09/19/2017 1321 Last data filed at 09/19/2017 0107 Gross per 24 hour  Intake 1210 ml  Output 300 ml  Net 910 ml   Filed Weights   09/17/17 2108 09/18/17 1131  Weight: 74.8 kg (165 lb) 73.5 kg (162 lb)    Exam: 09/19/2017   General: Pleasant 84 year old African-American female well-developed well-nourished.  In no acute distress.  Alert and oriented x3.  Cardiovascular: Regular rate and rhythm with  no rubs or gallops.  No JVD or thyromegaly present.  Respiratory: Mild rales at bases.  No wheezes   abdomen: Soft nontender nondistended normal bowel sounds x4  Musculoskeletal: No motor focal deficits  Skin: No rash noted  Psychiatry: Mood is appropriate for condition and setting   Data Reviewed: CBC: Recent Labs  Lab 09/17/17 2104  09/17/17 2118 09/19/17 0852  WBC 11.4*  --  5.9  HGB 11.5* 12.6 10.3*  HCT 35.2* 37.0 31.5*  MCV 84.6  --  83.1  PLT 159  --  124*   Basic Metabolic Panel: Recent Labs  Lab 09/17/17 2104 09/17/17 2118 09/17/17 2225 09/19/17 0852  NA 126* 128* 127* 137  K 5.5* 5.5* 5.3* 4.3  CL 95* 97* 93* 107  CO2 22  --  20* 23  GLUCOSE 105* 101* 101* 126*  BUN 27* 36* 28* 19  CREATININE 2.05* 2.20* 2.03* 1.17*  CALCIUM 8.3*  --  8.6* 8.9   GFR: Estimated Creatinine Clearance: 36.4 mL/min (A) (by C-G formula based on SCr of 1.17 mg/dL (H)). Liver Function Tests: Recent Labs  Lab 09/17/17 2104  AST 28  ALT 8*  ALKPHOS 101  BILITOT 0.6  PROT 7.1  ALBUMIN 3.2*   No results for input(s): LIPASE, AMYLASE in the last 168 hours. No results for input(s): AMMONIA in the last 168 hours. Coagulation Profile: No results for input(s): INR, PROTIME in the last 168 hours. Cardiac Enzymes: No results for input(s): CKTOTAL, CKMB, CKMBINDEX, TROPONINI in the last 168 hours. BNP (last 3 results) No results for input(s): PROBNP in the last 8760 hours. HbA1C: No results for input(s): HGBA1C in the last 72 hours. CBG: Recent Labs  Lab 09/17/17 2103  GLUCAP 94   Lipid Profile: No results for input(s): CHOL, HDL, LDLCALC, TRIG, CHOLHDL, LDLDIRECT in the last 72 hours. Thyroid Function Tests: Recent Labs    09/18/17 0003  TSH 1.313   Anemia Panel: No results for input(s): VITAMINB12, FOLATE, FERRITIN, TIBC, IRON, RETICCTPCT in the last 72 hours. Urine analysis:    Component Value Date/Time   COLORURINE YELLOW 09/17/2017 2337   APPEARANCEUR HAZY (A) 09/17/2017 2337   LABSPEC 1.006 09/17/2017 2337   PHURINE 6.0 09/17/2017 2337   GLUCOSEU NEGATIVE 09/17/2017 2337   HGBUR SMALL (A) 09/17/2017 2337   BILIRUBINUR NEGATIVE 09/17/2017 2337   KETONESUR NEGATIVE 09/17/2017 2337   PROTEINUR NEGATIVE 09/17/2017 2337   NITRITE NEGATIVE 09/17/2017 2337   LEUKOCYTESUR MODERATE (A) 09/17/2017  2337   Sepsis Labs: @LABRCNTIP (procalcitonin:4,lacticidven:4)  )No results found for this or any previous visit (from the past 240 hour(s)).    Studies: No results found.  Scheduled Meds: . amLODipine  5 mg Oral Daily  . enoxaparin (LOVENOX) injection  30 mg Subcutaneous Q24H  . ipratropium-albuterol  3 mL Nebulization Q6H  . pantoprazole  40 mg Oral Daily  . pravastatin  10 mg Oral q1800  . senna-docusate  1 tablet Oral BID  . sertraline  100 mg Oral BID  . sodium chloride HYPERTONIC  4 mL Nebulization TID  . Vitamin D (Ergocalciferol)  50,000 Units Oral Q7 days    Continuous Infusions: . azithromycin Stopped (09/19/17 0107)  . cefTRIAXone (ROCEPHIN)  IV Stopped (09/18/17 2327)     LOS: 2 days     Darlin Drop, MD [facility] Pager [phone]  If 7PM-7AM, please contact night-coverage [email or web address] Password [email or web address] 09/19/2017, 1:21 PM

## 2017-09-19 NOTE — Evaluation (Signed)
Clinical/Bedside Swallow Evaluation Patient Details  Name: Lahoma RockerLillie Bedoy MRN: [id number] Date of Birth: December 05, 1932  Today's Date: 09/19/2017 Time: SLP Start Time (ACUTE ONLY): 0802 SLP Stop Time (ACUTE ONLY): 0819 SLP Time Calculation (min) (ACUTE ONLY): 17 min  Past Medical History:  Past Medical History:  Diagnosis Date  . Arthritis   . Back pain   . Coronary artery disease   . Hypertension    Past Surgical History:  Past Surgical History:  Procedure Laterality Date  . ABDOMINAL HYSTERECTOMY    . CARDIAC SURGERY     stent placement   HPI:  Lahoma RockerLillie Chiles  is a 84 y.o. female, w hypertension, CAD s/p stent presents with c/o AMS,  CXR Multifocal ground-glass opacity in the right upper lobe and left base suspicious for multifocal pneumonia,  asymmetric right hilar opacity, cannot exclude mass. Recommend contrast-enhanced CT to further evaluate 3. Tiny pleural effusions. CT head no CT evidence for acute intracranial abnormality, small vessel ischemic changes of the white matter   Assessment / Plan / Recommendation Clinical Impression  Pt and daughter without complaints of pharyngeal or esophageal swallow difficulty prior to admission. Her oropharyngeal swallow function was within normal limits with effective mastication although lower dentures not present (intermittently wears when eating. No s/s aspiration during assessment across textures. Recommend pt continue regular/thin, straws allowed and pills with thin, no further ST needed.     SLP Visit Diagnosis: Dysphagia, unspecified (R13.10)    Aspiration Risk  Mild aspiration risk    Diet Recommendation Regular;Thin liquid   Liquid Administration via: Cup;Straw Medication Administration: Whole meds with liquid Supervision: Patient able to self feed Compensations: Slow rate;Small sips/bites Postural Changes: Seated upright at 90 degrees    Other  Recommendations Oral Care Recommendations: Oral care BID   Follow up  Recommendations None      Frequency and Duration            Prognosis        Swallow Study   General HPI: Lahoma RockerLillie Yang  is a 84 y.o. female, w hypertension, CAD s/p stent presents with c/o AMS,  CXR Multifocal ground-glass opacity in the right upper lobe and left base suspicious for multifocal pneumonia,  asymmetric right hilar opacity, cannot exclude mass. Recommend contrast-enhanced CT to further evaluate 3. Tiny pleural effusions. CT head no CT evidence for acute intracranial abnormality, small vessel ischemic changes of the white matter Type of Study: Bedside Swallow Evaluation Previous Swallow Assessment: (none) Diet Prior to this Study: Regular;Thin liquids Temperature Spikes Noted: No Respiratory Status: Nasal cannula History of Recent Intubation: No Behavior/Cognition: Alert;Cooperative;Pleasant mood Oral Cavity Assessment: Within Functional Limits Oral Care Completed by SLP: No Oral Cavity - Dentition: Dentures, top(bottom not here, eats without sometimes ) Vision: Functional for self-feeding Self-Feeding Abilities: Able to feed self Patient Positioning: Upright in bed Baseline Vocal Quality: Normal Volitional Cough: Strong Volitional Swallow: Able to elicit    Oral/Motor/Sensory Function Overall Oral Motor/Sensory Function: Within functional limits   Ice Chips Ice chips: Not tested   Thin Liquid Thin Liquid: Within functional limits Presentation: Cup    Nectar Thick Nectar Thick Liquid: Not tested   Honey Thick Honey Thick Liquid: Not tested   Puree Puree: Not tested   Solid   GO   Solid: Within functional limits        Royce MacadamiaLitaker, Ott Zimmerle Willis 09/19/2017,10:54 AM  Breck CoonsLisa Willis Lonell FaceLitaker M.Ed [employer]SLP Pager [phone]

## 2017-09-20 DIAGNOSIS — J189 Pneumonia, unspecified organism: Secondary | ICD-10-CM

## 2017-09-20 DIAGNOSIS — T402X1A Poisoning by other opioids, accidental (unintentional), initial encounter: Secondary | ICD-10-CM

## 2017-09-20 DIAGNOSIS — N179 Acute kidney failure, unspecified: Secondary | ICD-10-CM

## 2017-09-20 DIAGNOSIS — G9340 Encephalopathy, unspecified: Secondary | ICD-10-CM

## 2017-09-20 LAB — BASIC METABOLIC PANEL
ANION GAP: 9 (ref 5–15)
BUN: 16 mg/dL (ref 6–20)
CALCIUM: 9 mg/dL (ref 8.9–10.3)
CHLORIDE: 107 mmol/L (ref 101–111)
CO2: 21 mmol/L — AB (ref 22–32)
CREATININE: 1.1 mg/dL — AB (ref 0.44–1.00)
GFR calc non Af Amer: 45 mL/min — ABNORMAL LOW (ref >60)
GFR, EST AFRICAN AMERICAN: 53 mL/min — AB (ref >60)
Glucose, Bld: 88 mg/dL (ref 65–99)
Potassium: 4.6 mmol/L (ref 3.5–5.1)
Sodium: 137 mmol/L (ref 135–145)

## 2017-09-20 LAB — CBC
HEMATOCRIT: 31.4 % — AB (ref 36.0–46.0)
HEMOGLOBIN: 9.8 g/dL — AB (ref 12.0–15.0)
MCH: 26.3 pg (ref 26.0–34.0)
MCHC: 31.2 g/dL (ref 30.0–36.0)
MCV: 84.2 fL (ref 78.0–100.0)
Platelets: 130 10*3/uL — ABNORMAL LOW (ref 150–400)
RBC: 3.73 MIL/uL — ABNORMAL LOW (ref 3.87–5.11)
RDW: 14.6 % (ref 11.5–15.5)
WBC: 5.6 10*3/uL (ref 4.0–10.5)

## 2017-09-20 MED ORDER — ALBUTEROL SULFATE (2.5 MG/3ML) 0.083% IN NEBU: 2.5000 mg | INHALATION_SOLUTION | RESPIRATORY_TRACT | Status: DC | PRN

## 2017-09-20 MED ORDER — LEVOFLOXACIN 500 MG PO TABS: 500.0000 mg | ORAL_TABLET | Freq: Every day | ORAL | 0 refills | Status: AC

## 2017-09-20 MED ORDER — IPRATROPIUM-ALBUTEROL 0.5-2.5 (3) MG/3ML IN SOLN
3.0000 mL | Freq: Two times a day (BID) | RESPIRATORY_TRACT | Status: DC
Start: 2017-09-20 — End: 2017-09-20

## 2017-09-20 MED ORDER — AZITHROMYCIN 250 MG PO TABS: 500.0000 mg | ORAL_TABLET | Freq: Every day | ORAL | Status: DC

## 2017-09-20 NOTE — Discharge Summary (Signed)
Physician Discharge Summary  Katherine Burns WUJ:[id number] DOB: [birth year] DOA: 09/17/2017  PCP: Patient, No Pcp Per  Admit date: 09/17/2017 Discharge date: 09/20/2017  Admitted From: home Disposition:  Home   Recommendations for Outpatient Follow-up:  1. Follow up with PCP in 1-2 weeks 2. Continue Levaquin for 4 additional days  Home Health: none Equipment/Devices: electric wheelchair   Discharge Condition: stable CODE STATUS: Full code Diet recommendation: regular   HPI: Per Dr. Sinclair Burns Shamera Burnadette Burns  is a 84 y.o. female, w hypertension, CAD s/p stent presents with c/o AMS,  Starting yesterday evening.  Persisted today.  Pt denies fever, chills, cough, cp, palp, sob, n/v, diarrhea.   Pt was brought in for evaluation by her family.    In ED , CXR IMPRESSION: 1. Multifocal ground-glass opacity in the right upper lobe and left base suspicious for multifocal pneumonia 2. Asymmetric right hilar opacity, cannot exclude mass. Recommend contrast-enhanced CT to further evaluate 3. Tiny pleural effusions.   CT brain IMPRESSION: 1. No CT evidence for acute intracranial abnormality. 2. Small vessel ischemic changes of the white matter.  Na 127, K 5.3, Bun 28, Creatinine 2.03,  Wbc 11.4, Hgb 11.5 Plt 159 Trop 0.00  Pt will be admitted for AMS secondary to pneumonia, ARF, and hyperkalemia.     Hospital Course:  Multifocal community-acquired pneumonia with suspected aspiration -patient was admitted to the hospital with multifocal pneumonia, she was placed on ceftriaxone and azithromycin, received 3 days of IV antibiotics, she improved, she feels back to baseline, will be transitioned to Levaquin for 4 additional days on discharge.  She was hypoxic on admission, this resolved, and she is on room air.  CT scan on admission showed small volume of retained of aspirated secretions in the trachea and the thoracic inlet, speech therapy evaluated patient without significant  aspiration AKI on CKD 3 - Creatinine on admission 2.20, improved with fluids and is 1.1 on discharge Suspected dysphagia - ruled out Acute metabolic encephalopathy -resolved, most likely secondary to pneumonia versus hyponatremia versus dehydration, CT head negative for intracranial findings Hyponatremia/hyperkalemia -resolved Ambulatory dysfunction secondary to chronic spinal stenosis -she is wheelchair-bound at baseline   Discharge Diagnoses:  Active Problems:   Community acquired pneumonia   Multifocal pneumonia   Discharge Instructions   Allergies as of 09/20/2017   No Known Allergies     Medication List    TAKE these medications   allopurinol 100 MG tablet Commonly known as:  ZYLOPRIM Take 100 mg by mouth daily.   amLODipine 5 MG tablet Commonly known as:  NORVASC Take 5 mg by mouth daily.   cetirizine 10 MG tablet Commonly known as:  ZYRTEC Take 10 mg by mouth daily.   fentaNYL 75 MCG/HR Commonly known as:  DURAGESIC - dosed mcg/hr Place 1 patch onto the skin every 3 (three) days.   levofloxacin 500 MG tablet Commonly known as:  LEVAQUIN Take 1 tablet (500 mg total) by mouth daily.   lovastatin 10 MG tablet Commonly known as:  MEVACOR Take 10 mg by mouth daily.   metoprolol tartrate 25 MG tablet Commonly known as:  LOPRESSOR Take 25 mg by mouth 2 (two) times daily.   omeprazole 20 MG capsule Commonly known as:  PRILOSEC Take 20 mg by mouth 2 (two) times daily.   oxyCODONE-acetaminophen 10-325 MG tablet Commonly known as:  PERCOCET Take 1 tablet by mouth every 8 (eight) hours as needed. pain   sertraline 100 MG tablet Commonly known as:  ZOLOFT Take  100 mg by mouth 2 (two) times daily.   Vitamin D (Ergocalciferol) 50000 units Caps capsule Commonly known as:  DRISDOL Take 50,000 Units by mouth every 7 (seven) days.        Consultations:  none  Procedures/Studies:  Dg Chest 2 View  Result Date: 09/17/2017 CLINICAL DATA:  Confusion EXAM:  CHEST - 2 VIEW COMPARISON:  None. FINDINGS: Low lung volumes. Multifocal ground-glass opacity in the right upper lobe and left base suspicious for multifocal pneumonia. Tiny pleural effusions. Heart size within normal limits. Aortic atherosclerosis. Asymmetric right hilar opacity. IMPRESSION: 1. Multifocal ground-glass opacity in the right upper lobe and left base suspicious for multifocal pneumonia 2. Asymmetric right hilar opacity, cannot exclude mass. Recommend contrast-enhanced CT to further evaluate 3. Tiny pleural effusions. Electronically Signed   By: Jasmine Pang M.D.   On: 09/17/2017 22:18   Ct Head Wo Contrast  Result Date: 09/17/2017 CLINICAL DATA:  Altered LOC EXAM: CT HEAD WITHOUT CONTRAST TECHNIQUE: Contiguous axial images were obtained from the base of the skull through the vertex without intravenous contrast. COMPARISON:  None. FINDINGS: Brain: No acute territorial infarction, hemorrhage or intracranial mass is visualized. Mild small vessel ischemic changes of the white matter. Ventricle size within normal limits. Vascular: No hyperdense vessels.  Carotid vascular calcification. Skull: Normal. Negative for fracture or focal lesion. Sinuses/Orbits: Mild mucosal thickening in the ethmoid sinuses. No acute orbital abnormality. Other: None IMPRESSION: 1. No CT evidence for acute intracranial abnormality. 2. Small vessel ischemic changes of the white matter. Electronically Signed   By: Jasmine Pang M.D.   On: 09/17/2017 22:33   Ct Chest Wo Contrast  Result Date: 09/17/2017 CLINICAL DATA:  84 year old female with weakness and lethargy. Progressive symptoms since yesterday. EXAM: CT CHEST WITHOUT CONTRAST TECHNIQUE: Multidetector CT imaging of the chest was performed following the standard protocol without IV contrast. COMPARISON:  Chest radiographs 1922 hours today. FINDINGS: Cardiovascular: Vascular patency is not evaluated in the absence of IV contrast. Tortuous and mildly ectatic thoracic  aorta with mild calcified atherosclerosis. Calcified coronary artery atherosclerosis. Cardiac size is within normal limits. No pericardial effusion. Mediastinum/Nodes: Negative.  No mediastinal lymphadenopathy. Lungs/Pleura: Bilateral paraseptal emphysema, most pronounced in the right upper lobe. Intermittent respiratory motion artifact. Mild to moderate bilateral lower lobe costophrenic angle subpleural opacity which might reflect a combination of atelectasis and scarring. No consolidation. There is a small volume of retained or aspirated secretions in the trachea at the thoracic inlet on series 8, image 19, but otherwise the major airways are patent. No pleural effusion. Upper Abdomen: Visible noncontrast upper abdominal viscera remarkable for mild diverticulosis at the splenic flexure and small low-density areas in the kidneys which are probably benign cysts. Musculoskeletal: Thoracic spine degeneration. No acute osseous abnormality identified. IMPRESSION: 1. Chronic lung disease with Emphysema (ICD10-J43.9). No convincing superimposed acute or inflammatory pulmonary findings. 2. Small volume retained or aspirated secretions in the trachea at the thoracic inlet. 3. Mild ectasia of the thoracic aorta. Calcified coronary artery and to a lesser extent aortic atherosclerosis. Electronically Signed   By: Odessa Fleming M.D.   On: 09/17/2017 23:51   US Renal  Result Date: 09/18/2017 CLINICAL DATA:  84 y/o  F; acute renal failure. EXAM: RENAL / URINARY TRACT ULTRASOUND COMPLETE COMPARISON:  None. FINDINGS: Right Kidney: Length: 10.5 cm. Normal echogenicity. Right kidney simple appearing cysts measuring 2.3 and 1.2 cm. No hydronephrosis. Left Kidney: Length: 10.1 cm. Echogenicity within normal limits. No mass or hydronephrosis visualized. Bladder: Mild bladder  distention. IMPRESSION: 1. No acute process identified. 2. Simple right kidney cyst measuring up to 2.3 cm. 3. Mild bladder distention. Electronically Signed   By:  Mitzi Hansen M.D.   On: 09/18/2017 01:45      Subjective: - no chest pain, shortness of breath, no abdominal pain, nausea or vomiting.   Discharge Exam: Vitals:   09/20/17 0817 09/20/17 0845  BP: (!) 142/89   Pulse: 92   Resp: 18   Temp: 99.6 F (37.6 C)   SpO2: 97% 98%    General: Pt is alert, awake, not in acute distress Cardiovascular: RRR, S1/S2 +, no rubs, no gallops Respiratory: CTA bilaterally, no wheezing, no rhonchi Abdominal: Soft, NT, ND, bowel sounds + Extremities: no edema, no cyanosis    The results of significant diagnostics from this hospitalization (including imaging, microbiology, ancillary and laboratory) are listed below for reference.     Microbiology: Recent Results (from the past 240 hour(s))  Blood culture (routine x 2)     Status: None (Preliminary result)   Collection Time: 09/17/17 12:18 AM  Result Value Ref Range Status   Specimen Description BLOOD BLOOD RIGHT FOREARM  Final   Special Requests IN PEDIATRIC BOTTLE Blood Culture adequate volume  Final   Culture   Final    NO GROWTH 1 DAY Performed at [facility], [address] [place], [place] [address]    Report Status PENDING  Incomplete  Blood culture (routine x 2)     Status: None (Preliminary result)   Collection Time: 09/17/17 11:57 PM  Result Value Ref Range Status   Specimen Description BLOOD RIGHT ANTECUBITAL  Final   Special Requests   Final    BOTTLES DRAWN AEROBIC AND ANAEROBIC Blood Culture adequate volume   Culture   Final    NO GROWTH 1 DAY Performed at [facility], [address] [place], [place] [address]    Report Status PENDING  Incomplete     Labs: BNP (last 3 results) No results for input(s): BNP in the last 8760 hours. Basic Metabolic Panel: Recent Labs  Lab 09/17/17 2104 09/17/17 2118 09/17/17 2225 09/19/17 0852 09/20/17 0421  NA 126* 128* 127* 137 137  K 5.5* 5.5* 5.3* 4.3 4.6  CL 95* 97* 93* 107 107  CO2 22  --  20* 23  21*  GLUCOSE 105* 101* 101* 126* 88  BUN 27* 36* 28* 19 16  CREATININE 2.05* 2.20* 2.03* 1.17* 1.10*  CALCIUM 8.3*  --  8.6* 8.9 9.0   Liver Function Tests: Recent Labs  Lab 09/17/17 2104  AST 28  ALT 8*  ALKPHOS 101  BILITOT 0.6  PROT 7.1  ALBUMIN 3.2*   No results for input(s): LIPASE, AMYLASE in the last 168 hours. No results for input(s): AMMONIA in the last 168 hours. CBC: Recent Labs  Lab 09/17/17 2104 09/17/17 2118 09/19/17 0852 09/20/17 0421  WBC 11.4*  --  5.9 5.6  HGB 11.5* 12.6 10.3* 9.8*  HCT 35.2* 37.0 31.5* 31.4*  MCV 84.6  --  83.1 84.2  PLT 159  --  124* 130*   Cardiac Enzymes: No results for input(s): CKTOTAL, CKMB, CKMBINDEX, TROPONINI in the last 168 hours. BNP: Invalid input(s): POCBNP CBG: Recent Labs  Lab 09/17/17 2103  GLUCAP 94   D-Dimer No results for input(s): DDIMER in the last 72 hours. Hgb A1c No results for input(s): HGBA1C in the last 72 hours. Lipid Profile No results for input(s): CHOL, HDL, LDLCALC, TRIG,  CHOLHDL, LDLDIRECT in the last 72 hours. Thyroid function studies Recent Labs    09/18/17 0003  TSH 1.313   Anemia work up No results for input(s): VITAMINB12, FOLATE, FERRITIN, TIBC, IRON, RETICCTPCT in the last 72 hours. Urinalysis    Component Value Date/Time   COLORURINE YELLOW 09/17/2017 2337   APPEARANCEUR HAZY (A) 09/17/2017 2337   LABSPEC 1.006 09/17/2017 2337   PHURINE 6.0 09/17/2017 2337   GLUCOSEU NEGATIVE 09/17/2017 2337   HGBUR SMALL (A) 09/17/2017 2337   BILIRUBINUR NEGATIVE 09/17/2017 2337   KETONESUR NEGATIVE 09/17/2017 2337   PROTEINUR NEGATIVE 09/17/2017 2337   NITRITE NEGATIVE 09/17/2017 2337   LEUKOCYTESUR MODERATE (A) 09/17/2017 2337   Sepsis Labs Invalid input(s): PROCALCITONIN,  WBC,  LACTICIDVEN   Time coordinating discharge: 45 minutes  SIGNED:  Pamella Pert, MD  [facility] 09/20/2017, 11:42 AM Pager [phone]  If 7PM-7AM, please contact  night-coverage [email or web address] Password [email or web address]

## 2017-09-20 NOTE — Plan of Care (Signed)
Discussed with patient and family plan of care for the evening, pain management and antibiotics with some teach back displayed

## 2017-09-20 NOTE — Progress Notes (Signed)
PT Cancellation Note  Patient Details Name: Katherine RockerLillie Burns MRN: [id number] DOB: 10-01-1932   Cancelled Treatment:    Reason Eval/Treat Not Completed: Patient declined, no reason specified. Pt declining PT at this time. Pt reports that she has all DME necessary at home and assistance at home as well. Pt also declining f/u HHPT services at this time. PT will continue to f/u with pt as available.    [name] 09/20/2017, 10:43 AM

## 2017-09-20 NOTE — Progress Notes (Signed)
Pt awaited discharge for family to arrive with clothes and car.  Family here now.  Pt alert, no signs of distress. PIV removed.  Pt and family with all belongings.  D/C summary reviewed, all questions answered. Pt to personal car via w/c with multiple family members present.

## 2017-09-20 NOTE — Progress Notes (Signed)
PHARMACIST - PHYSICIAN COMMUNICATION  CONCERNING: Antibiotic IV to Oral Route Change Policy  RECOMMENDATION: This patient is receiving Azithromycin by the intravenous route.  Based on criteria approved by the Pharmacy and Therapeutics Committee, the antibiotic(s) is/are being converted to the equivalent oral dose form(s).   DESCRIPTION:  These criteria include:  Patient being treated for a respiratory tract infection, urinary tract infection, cellulitis or clostridium difficile associated diarrhea if on metronidazole  The patient is not neutropenic and does not exhibit a GI malabsorption state  The patient is eating (either orally or via tube) and/or has been taking other orally administered medications for a least 24 hours  The patient is improving clinically and has a Tmax < 100.5  If you have questions about this conversion, please contact the Pharmacy Department  []  ( [phone] )  [place] []  ( [phone]  [facility] [x]  ( [phone] )  [place] []  ( [phone]  [facility] []  ( [phone] )  [facility]    Tyrene Nader, PharmD Pharmacy Resident Pager: [phone]  

## 2017-09-20 NOTE — Discharge Instructions (Signed)
Follow with Primary MD as soon as you return home   Please get a complete blood count and chemistry panel checked by your Primary MD at your next visit, and again as instructed by your Primary MD. Please get your medications reviewed and adjusted by your Primary MD.  Please request your Primary MD to go over all Hospital Tests and Procedure/Radiological results at the follow up, please get all Hospital records sent to your Prim MD by signing hospital release before you go home.  If you had Pneumonia of Lung problems at the Hospital: Please get a 2 view Chest X ray done in 6-8 weeks after hospital discharge or sooner if instructed by your Primary MD.  If you have Congestive Heart Failure: Please call your Cardiologist or Primary MD anytime you have any of the following symptoms:  1) 3 pound weight gain in 24 hours or 5 pounds in 1 week  2) shortness of breath, with or without a dry hacking cough  3) swelling in the hands, feet or stomach  4) if you have to sleep on extra pillows at night in order to breathe  Follow cardiac low salt diet and 1.5 lit/day fluid restriction.  If you have diabetes Accuchecks 4 times/day, Once in AM empty stomach and then before each meal. Log in all results and show them to your primary doctor at your next visit. If any glucose reading is under 80 or above 300 call your primary MD immediately.  If you have Seizure/Convulsions/Epilepsy: Please do not drive, operate heavy machinery, participate in activities at heights or participate in high speed sports until you have seen by Primary MD or a Neurologist and advised to do so again.  If you had Gastrointestinal Bleeding: Please ask your Primary MD to check a complete blood count within one week of discharge or at your next visit. Your endoscopic/colonoscopic biopsies that are pending at the time of discharge, will also need to followed by your Primary MD.  Get Medicines reviewed and adjusted. Please take all  your medications with you for your next visit with your Primary MD  Please request your Primary MD to go over all hospital tests and procedure/radiological results at the follow up, please ask your Primary MD to get all Hospital records sent to his/her office.  If you experience worsening of your admission symptoms, develop shortness of breath, life threatening emergency, suicidal or homicidal thoughts you must seek medical attention immediately by calling 911 or calling your MD immediately  if symptoms less severe.  You must read complete instructions/literature along with all the possible adverse reactions/side effects for all the Medicines you take and that have been prescribed to you. Take any new Medicines after you have completely understood and accpet all the possible adverse reactions/side effects.   Do not drive or operate heavy machinery when taking Pain medications.   Do not take more than prescribed Pain, Sleep and Anxiety Medications  Special Instructions: If you have smoked or chewed Tobacco  in the last 2 yrs please stop smoking, stop any regular Alcohol  and or any Recreational drug use.  Wear Seat belts while driving.  Please note You were cared for by a hospitalist during your hospital stay. If you have any questions about your discharge medications or the care you received while you were in the hospital after you are discharged, you can call the unit and asked to speak with the hospitalist on call if the hospitalist that took care of you is  not available. Once you are discharged, your primary care physician will handle any further medical issues. Please note that NO REFILLS for any discharge medications will be authorized once you are discharged, as it is imperative that you return to your primary care physician (or establish a relationship with a primary care physician if you do not have one) for your aftercare needs so that they can reassess your need for medications and monitor  your lab values.  You can reach the hospitalist office at phone [phone] or fax [phone]   If you do not have a primary care physician, you can call [phone] for a physician referral.  Activity: As tolerated with Full fall precautions use walker/cane & assistance as needed  Diet: regular  Disposition Home

## 2017-09-23 LAB — CULTURE, BLOOD (ROUTINE X 2)
CULTURE: NO GROWTH
Culture: NO GROWTH
SPECIAL REQUESTS: ADEQUATE
SPECIAL REQUESTS: ADEQUATE

## 2020-03-17 IMAGING — US US RENAL
1 series · 14 of 25 positions shown · non-contrast
Comparison: None.

CLINICAL DATA: 84 y/o  F; acute renal failure.

EXAM:
RENAL / URINARY TRACT ULTRASOUND COMPLETE

[Series 1: us renal · 0.21mm/px · 14 of 54 slices shown]
[im 1/54]
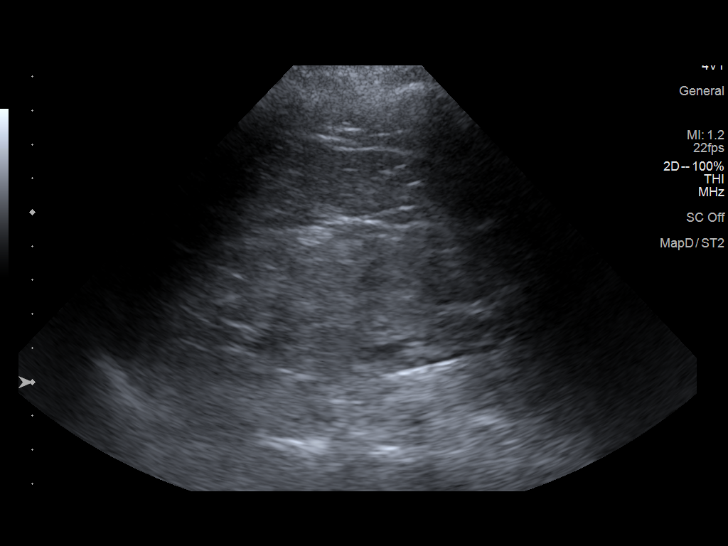
[im 5/54]
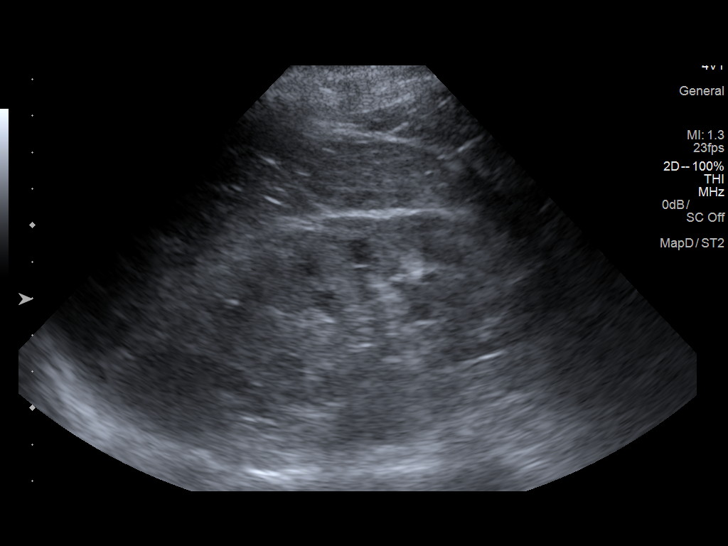
[im 9/54]
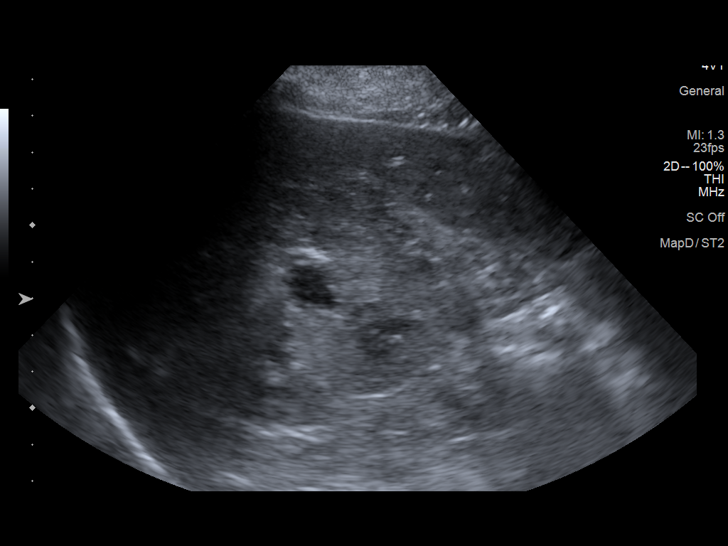
[im 14/54]
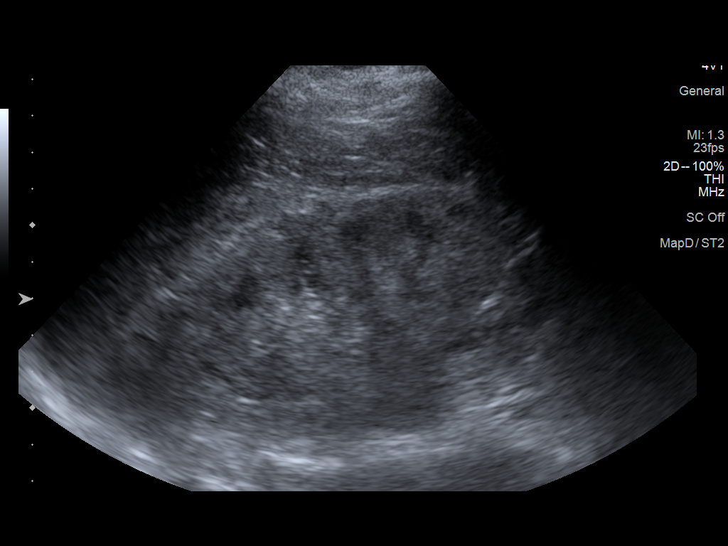
[im 18/54]
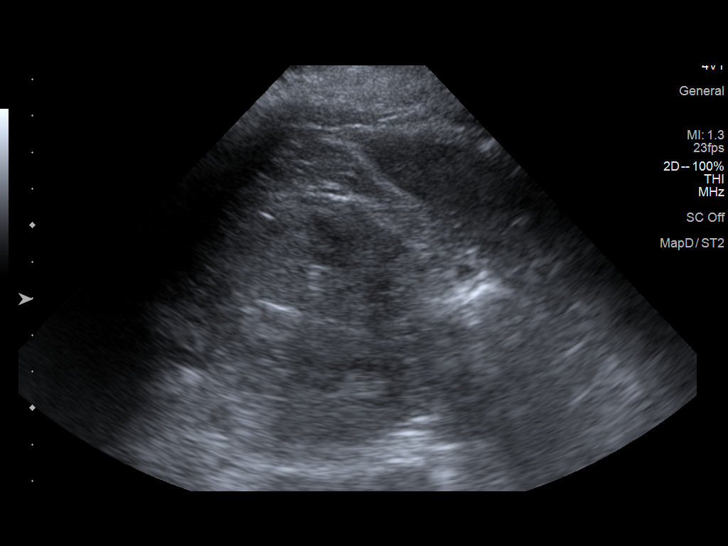
[im 20/54]
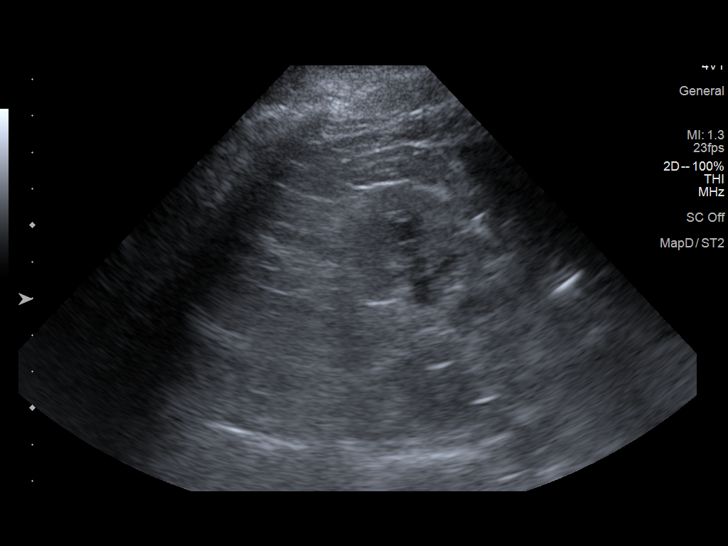
[im 25/54]
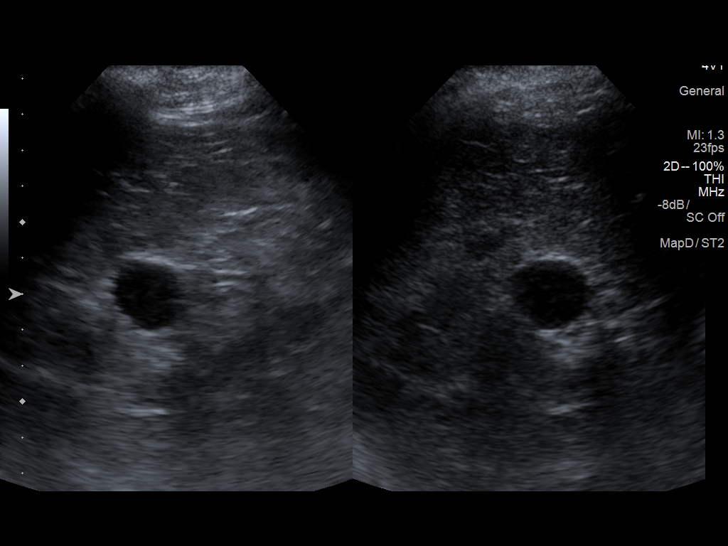
[im 29/54]
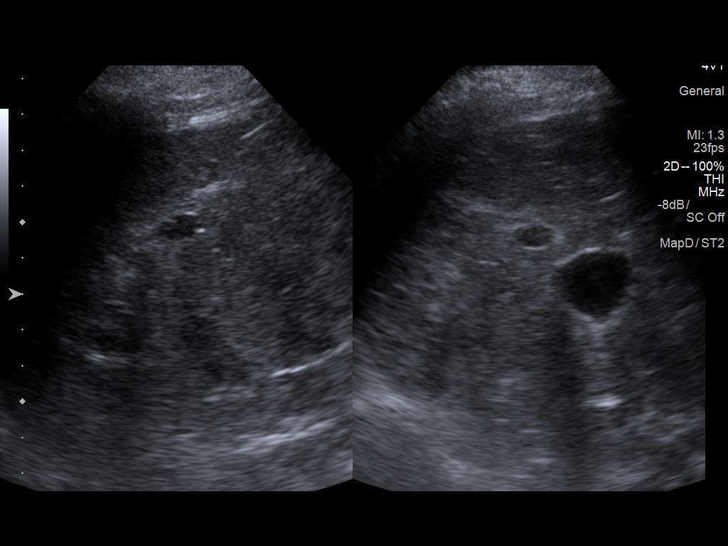
[im 34/54]
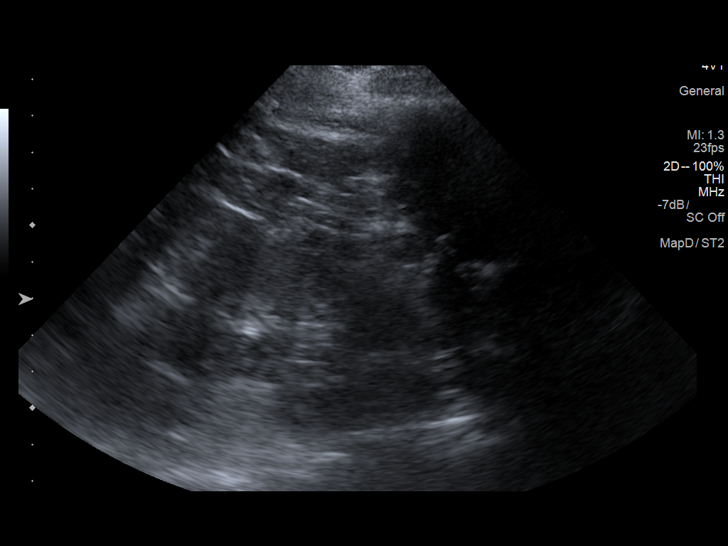
[im 36/54]
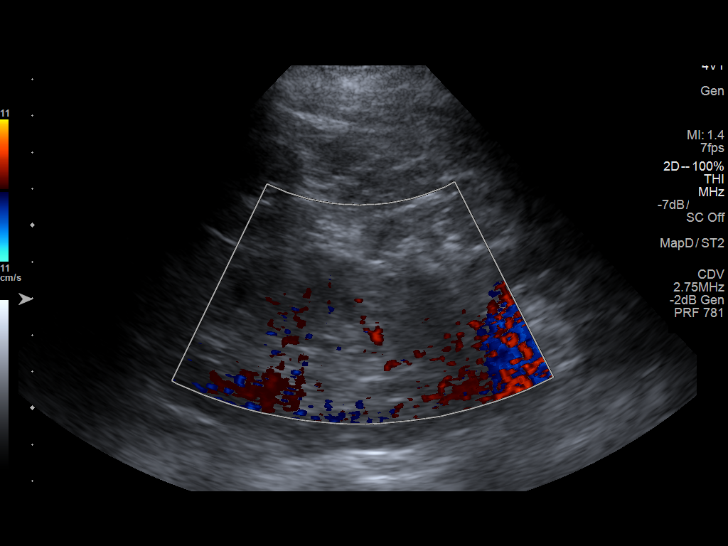
[im 40/54]
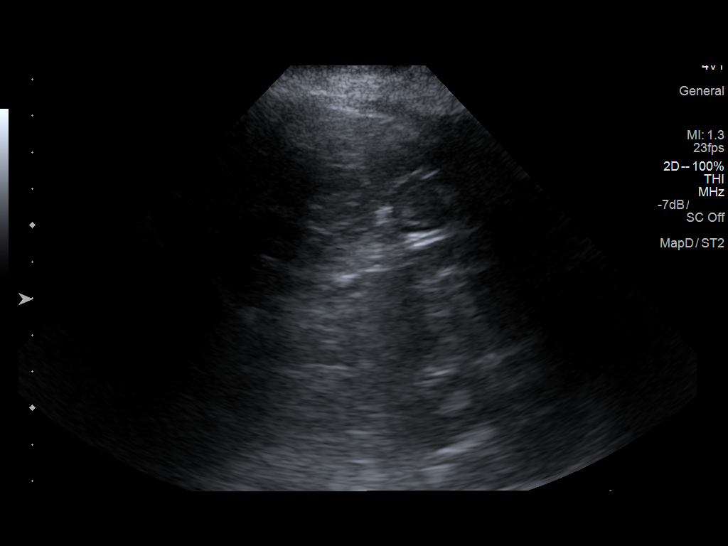
[im 45/54]
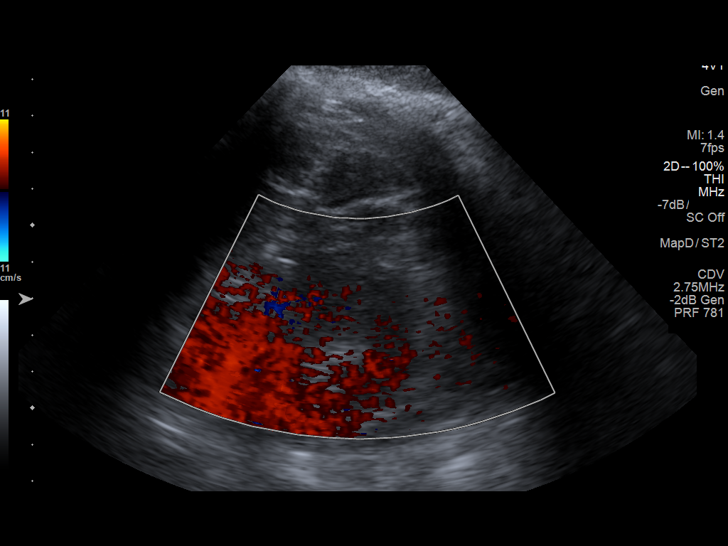
[im 49/54]
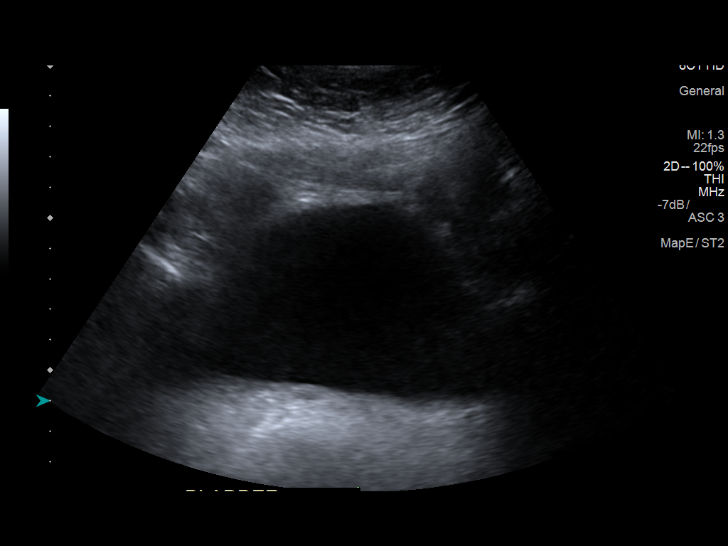
[im 54/54]
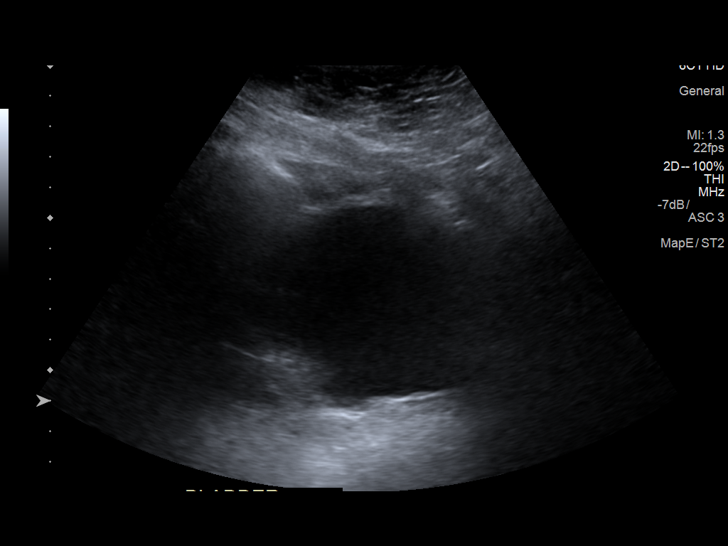

[14 of 25 positions shown; findings below may reference images not displayed]

FINDINGS: Right Kidney:

Length: 10.5 cm. Normal echogenicity. Right kidney simple appearing
cysts measuring 2.3 and 1.2 cm. No hydronephrosis.

Left Kidney:

Length: 10.1 cm. Echogenicity within normal limits. No mass or
hydronephrosis visualized.

Bladder:

Mild bladder distention.
IMPRESSION: 1. No acute process identified.
2. Simple right kidney cyst measuring up to 2.3 cm.
3. Mild bladder distention.

By: Harshad Crisostomo M.D.

## 2022-04-11 HISTORY — DX: Hemiplegia, unspecified affecting right dominant side: G81.91

## 2022-04-11 HISTORY — DX: Dependence on wheelchair: Z99.3

## 2022-04-12 ENCOUNTER — Inpatient Hospital Stay
Admission: AD | Admit: 2022-04-12 | Discharge: 2022-04-15 | DRG: 682 | Disposition: A | Discharge status: Home Health | Payer: No Typology Code available for payment source | Source: Other Acute Inpatient Hospital | Attending: Nephrology | Admitting: Nephrology

## 2022-04-12 ENCOUNTER — Encounter: Payer: Self-pay | Admitting: Internal Medicine

## 2022-04-12 DIAGNOSIS — I480 Paroxysmal atrial fibrillation: Secondary | ICD-10-CM | POA: Diagnosis present

## 2022-04-12 DIAGNOSIS — Z87891 Personal history of nicotine dependence: Secondary | ICD-10-CM

## 2022-04-12 DIAGNOSIS — R532 Functional quadriplegia: Secondary | ICD-10-CM | POA: Diagnosis present

## 2022-04-12 DIAGNOSIS — Z79899 Other long term (current) drug therapy: Secondary | ICD-10-CM

## 2022-04-12 DIAGNOSIS — E860 Dehydration: Secondary | ICD-10-CM | POA: Diagnosis present

## 2022-04-12 DIAGNOSIS — I69351 Hemiplegia and hemiparesis following cerebral infarction affecting right dominant side: Secondary | ICD-10-CM

## 2022-04-12 DIAGNOSIS — E871 Hypo-osmolality and hyponatremia: Secondary | ICD-10-CM | POA: Diagnosis present

## 2022-04-12 DIAGNOSIS — E875 Hyperkalemia: Secondary | ICD-10-CM | POA: Diagnosis present

## 2022-04-12 DIAGNOSIS — N170 Acute kidney failure with tubular necrosis: Principal | ICD-10-CM | POA: Diagnosis present

## 2022-04-12 DIAGNOSIS — A09 Infectious gastroenteritis and colitis, unspecified: Secondary | ICD-10-CM | POA: Diagnosis present

## 2022-04-12 DIAGNOSIS — R197 Diarrhea, unspecified: Secondary | ICD-10-CM | POA: Diagnosis present

## 2022-04-12 DIAGNOSIS — Z7951 Long term (current) use of inhaled steroids: Secondary | ICD-10-CM

## 2022-04-12 DIAGNOSIS — Z7901 Long term (current) use of anticoagulants: Secondary | ICD-10-CM

## 2022-04-12 DIAGNOSIS — Z993 Dependence on wheelchair: Secondary | ICD-10-CM

## 2022-04-12 DIAGNOSIS — Z7982 Long term (current) use of aspirin: Secondary | ICD-10-CM

## 2022-04-12 LAB — COMPREHENSIVE METABOLIC PANEL
ALT: 15 U/L (ref 0–55)
AST (SGOT): 19 U/L (ref 5–41)
Albumin/Globulin Ratio: 1.1 (ref 0.9–2.2)
Albumin: 3.3 g/dL — ABNORMAL LOW (ref 3.5–5.0)
Alkaline Phosphatase: 99 U/L (ref 37–117)
Anion Gap: 11 (ref 5.0–15.0)
BUN: 50 mg/dL — ABNORMAL HIGH (ref 7.0–21.0)
Bilirubin, Total: 0.6 mg/dL (ref 0.2–1.2)
CO2: 13 mEq/L — ABNORMAL LOW (ref 17–29)
Calcium: 8.9 mg/dL (ref 7.9–10.2)
Chloride: 104 mEq/L (ref 99–111)
Creatinine: 1.9 mg/dL — ABNORMAL HIGH (ref 0.4–1.0)
Globulin: 3.1 g/dL (ref 2.0–3.6)
Glucose: 74 mg/dL (ref 70–100)
Potassium: 6.8 mEq/L (ref 3.5–5.3)
Protein, Total: 6.4 g/dL (ref 6.0–8.3)
Sodium: 128 mEq/L — ABNORMAL LOW (ref 135–145)
eGFR: 25.2 mL/min/{1.73_m2} — AB (ref >=60)

## 2022-04-12 LAB — STOOL FOR SALMONELLA,SHIGELLA,CAMPYLOBACTER AND SHIGA TOXIN PCR
Stool Campylobacter jejunii/coli by PCR: NEGATIVE
Stool Salmonella Species by PCR: NEGATIVE
Stool Shiga Toxin by PCR: NEGATIVE
Stool Shigella Species/Enteroinvasive Escherichia coli PCR: NEGATIVE

## 2022-04-12 LAB — BASIC METABOLIC PANEL
Anion Gap: 11 (ref 5.0–15.0)
BUN: 44 mg/dL — ABNORMAL HIGH (ref 7.0–21.0)
CO2: 20 mEq/L (ref 17–29)
Calcium: 8.8 mg/dL (ref 7.9–10.2)
Chloride: 105 mEq/L (ref 99–111)
Creatinine: 1.6 mg/dL — ABNORMAL HIGH (ref 0.4–1.0)
Glucose: 166 mg/dL — ABNORMAL HIGH (ref 70–100)
Potassium: 5.1 mEq/L (ref 3.5–5.3)
Sodium: 136 mEq/L (ref 135–145)
eGFR: 31 mL/min/{1.73_m2} — AB (ref >=60)

## 2022-04-12 LAB — CBC AND DIFFERENTIAL
Absolute NRBC: 0 10*3/uL (ref 0.00–0.00)
Basophils Absolute Automated: 0.06 10*3/uL (ref 0.00–0.08)
Basophils Automated: 1 %
Eosinophils Absolute Automated: 0.29 10*3/uL (ref 0.00–0.44)
Eosinophils Automated: 4.6 %
Hematocrit: 35.7 % (ref 34.7–43.7)
Hgb: 11.5 g/dL (ref 11.4–14.8)
Immature Granulocytes Absolute: 0.01 10*3/uL (ref 0.00–0.07)
Immature Granulocytes: 0.2 %
Instrument Absolute Neutrophil Count: 2.96 10*3/uL (ref 1.10–6.33)
Lymphocytes Absolute Automated: 2.5 10*3/uL (ref 0.42–3.22)
Lymphocytes Automated: 39.6 %
MCH: 28.7 pg (ref 25.1–33.5)
MCHC: 32.2 g/dL (ref 31.5–35.8)
MCV: 89 fL (ref 78.0–96.0)
MPV: 10.7 fL (ref 8.9–12.5)
Monocytes Absolute Automated: 0.49 10*3/uL (ref 0.21–0.85)
Monocytes: 7.8 %
Neutrophils Absolute: 2.96 10*3/uL (ref 1.10–6.33)
Neutrophils: 46.8 %
Nucleated RBC: 0 /100 WBC (ref 0.0–0.0)
Platelets: 188 10*3/uL (ref 142–346)
RBC: 4.01 10*6/uL (ref 3.90–5.10)
RDW: 12 % (ref 11–15)
WBC: 6.31 10*3/uL (ref 3.10–9.50)

## 2022-04-12 LAB — RENAL FUNCTION PANEL
Albumin: 2.9 g/dL — ABNORMAL LOW (ref 3.5–5.0)
Anion Gap: 9 (ref 5.0–15.0)
BUN: 45 mg/dL — ABNORMAL HIGH (ref 7.0–21.0)
CO2: 17 mEq/L (ref 17–29)
Calcium: 8.4 mg/dL (ref 7.9–10.2)
Chloride: 102 mEq/L (ref 99–111)
Creatinine: 1.9 mg/dL — ABNORMAL HIGH (ref 0.4–1.0)
Glucose: 332 mg/dL — ABNORMAL HIGH (ref 70–100)
Phosphorus: 3.2 mg/dL (ref 2.3–4.7)
Potassium: 5.4 mEq/L — ABNORMAL HIGH (ref 3.5–5.3)
Sodium: 128 mEq/L — ABNORMAL LOW (ref 135–145)
eGFR: 25.2 mL/min/{1.73_m2} — AB (ref >=60)

## 2022-04-12 LAB — ECG 12-LEAD
Atrial Rate: 59 {beats}/min
P Axis: 57 degrees
P-R Interval: 216 ms
Q-T Interval: 416 ms
QRS Duration: 80 ms
QTC Calculation (Bezet): 411 ms
R Axis: 5 degrees
T Axis: 62 degrees
Ventricular Rate: 59 {beats}/min

## 2022-04-12 LAB — MAGNESIUM: Magnesium: 1.6 mg/dL (ref 1.6–2.6)

## 2022-04-12 LAB — GLUCOSE WHOLE BLOOD - POCT
Whole Blood Glucose POCT: 201 mg/dL — ABNORMAL HIGH (ref 70–100)
Whole Blood Glucose POCT: 181 mg/dL — ABNORMAL HIGH (ref 70–100)
Whole Blood Glucose POCT: 118 mg/dL — ABNORMAL HIGH (ref 70–100)

## 2022-04-12 MED ORDER — POTASSIUM CHLORIDE CRYS ER 20 MEQ PO TBCR
0.0000 meq | EXTENDED_RELEASE_TABLET | ORAL | Status: DC | PRN
Start: 2022-04-12 — End: 2022-04-12

## 2022-04-12 MED ORDER — ALBUTEROL SULFATE (2.5 MG/3ML) 0.083% IN NEBU
10.0000 mg | INHALATION_SOLUTION | Freq: Once | RESPIRATORY_TRACT | Status: AC
Start: 2022-04-12 — End: 2022-04-12
  Administered 2022-04-12: 10 mg via RESPIRATORY_TRACT
  Filled 2022-04-12: qty 12

## 2022-04-12 MED ORDER — APIXABAN 2.5 MG PO TABS
2.5000 mg | ORAL_TABLET | Freq: Two times a day (BID) | ORAL | Status: DC
Start: 2022-04-12 — End: 2022-04-12

## 2022-04-12 MED ORDER — POTASSIUM CHLORIDE 10 MEQ/100ML IV SOLN
10.0000 meq | INTRAVENOUS | Status: DC | PRN
Start: 2022-04-12 — End: 2022-04-12

## 2022-04-12 MED ORDER — SODIUM POLYSTYRENE SULFONATE 15 GM/60ML PO SUSP
30.0000 g | Freq: Once | ORAL | Status: AC
Start: 2022-04-12 — End: 2022-04-12
  Administered 2022-04-12: 30 g via ORAL
  Filled 2022-04-12: qty 120

## 2022-04-12 MED ORDER — APIXABAN 2.5 MG PO TABS
2.5000 mg | ORAL_TABLET | Freq: Two times a day (BID) | ORAL | Status: DC
Start: 2022-04-13 — End: 2022-04-15
  Administered 2022-04-13 – 2022-04-15 (×6): 2.5 mg via ORAL
  Filled 2022-04-12 (×6): qty 1

## 2022-04-12 MED ORDER — ONDANSETRON HCL 4 MG/2ML IJ SOLN
4.0000 mg | Freq: Three times a day (TID) | INTRAMUSCULAR | Status: DC | PRN
Start: 2022-04-12 — End: 2022-04-15

## 2022-04-12 MED ORDER — INSULIN SYRINGES (DISPOSABLE) U-100 1 ML MISC
5.0000 [IU] | Freq: Once | Status: AC
Start: 2022-04-12 — End: 2022-04-12
  Administered 2022-04-12: 5 [IU] via INTRAVENOUS
  Filled 2022-04-12: qty 15

## 2022-04-12 MED ORDER — SODIUM BICARBONATE 8.4 % IV SOLN
100.0000 mL/h | INTRAVENOUS | Status: DC
Start: 2022-04-12 — End: 2022-04-12
  Filled 2022-04-12: qty 1000

## 2022-04-12 MED ORDER — ACETAMINOPHEN 325 MG PO TABS
650.0000 mg | ORAL_TABLET | Freq: Four times a day (QID) | ORAL | Status: DC | PRN
Start: 2022-04-12 — End: 2022-04-15

## 2022-04-12 MED ORDER — GLUCOSE 40 % PO GEL (WRAP)
15.0000 g | ORAL | Status: DC | PRN
Start: 2022-04-12 — End: 2022-04-15

## 2022-04-12 MED ORDER — SODIUM BICARBONATE 8.4 % IV SOLN
50.0000 meq | Freq: Once | INTRAVENOUS | Status: AC
Start: 2022-04-12 — End: 2022-04-12
  Administered 2022-04-12: 50 meq via INTRAVENOUS
  Filled 2022-04-12: qty 50

## 2022-04-12 MED ORDER — CETIRIZINE HCL 10 MG PO TABS
5.0000 mg | ORAL_TABLET | Freq: Every day | ORAL | Status: DC
Start: 2022-04-13 — End: 2022-04-15
  Administered 2022-04-13 – 2022-04-15 (×3): 5 mg via ORAL
  Filled 2022-04-12 (×3): qty 1

## 2022-04-12 MED ORDER — ENOXAPARIN SODIUM 40 MG/0.4ML IJ SOSY
40.0000 mg | PREFILLED_SYRINGE | Freq: Every day | INTRAMUSCULAR | Status: DC
Start: 2022-04-12 — End: 2022-04-12

## 2022-04-12 MED ORDER — ACETAMINOPHEN 650 MG RE SUPP
650.0000 mg | Freq: Four times a day (QID) | RECTAL | Status: DC | PRN
Start: 2022-04-12 — End: 2022-04-15

## 2022-04-12 MED ORDER — FLUTICASONE PROPIONATE 50 MCG/ACT NA SUSP
1.0000 | Freq: Every day | NASAL | Status: DC | PRN
Start: 2022-04-12 — End: 2022-04-15
  Administered 2022-04-15: 1 via NASAL
  Filled 2022-04-12: qty 16

## 2022-04-12 MED ORDER — CALCIUM GLUCONATE-NACL 1-0.675 GM/50ML-% IV SOLN
1.0000 g | Freq: Once | INTRAVENOUS | Status: AC
Start: 2022-04-12 — End: 2022-04-12
  Administered 2022-04-12: 1 g via INTRAVENOUS
  Filled 2022-04-12: qty 50

## 2022-04-12 MED ORDER — HEPARIN SODIUM (PORCINE) 5000 UNIT/ML IJ SOLN
5000.0000 [IU] | Freq: Two times a day (BID) | INTRAMUSCULAR | Status: DC
Start: 2022-04-12 — End: 2022-04-12
  Administered 2022-04-12: 5000 [IU] via SUBCUTANEOUS
  Filled 2022-04-12 (×2): qty 1

## 2022-04-12 MED ORDER — SODIUM BICARBONATE 8.4 % IV SOLN
100.0000 mL/h | INTRAVENOUS | Status: AC
Start: 2022-04-12 — End: 2022-04-12
  Administered 2022-04-12: 100 mL/h via INTRAVENOUS
  Filled 2022-04-12: qty 1000

## 2022-04-12 MED ORDER — MONTELUKAST SODIUM 10 MG PO TABS
10.0000 mg | ORAL_TABLET | Freq: Every evening | ORAL | Status: DC
Start: 2022-04-12 — End: 2022-04-15
  Administered 2022-04-13 – 2022-04-14 (×3): 10 mg via ORAL
  Filled 2022-04-12 (×3): qty 1

## 2022-04-12 MED ORDER — DEXTROSE 10 % IV BOLUS
12.5000 g | INTRAVENOUS | Status: DC | PRN
Start: 2022-04-12 — End: 2022-04-15

## 2022-04-12 MED ORDER — GLUCAGON 1 MG IJ SOLR (WRAP)
1.0000 mg | INTRAMUSCULAR | Status: DC | PRN
Start: 2022-04-12 — End: 2022-04-15

## 2022-04-12 MED ORDER — DEXTROSE 10 % IV BOLUS
50.0000 g | Freq: Once | INTRAVENOUS | Status: AC
Start: 2022-04-12 — End: 2022-04-12
  Administered 2022-04-12: 500 mL via INTRAVENOUS

## 2022-04-12 MED ORDER — MAGNESIUM SULFATE IN D5W 1-5 GM/100ML-% IV SOLN
1.0000 g | INTRAVENOUS | Status: DC | PRN
Start: 2022-04-12 — End: 2022-04-15

## 2022-04-12 MED ORDER — METOPROLOL SUCCINATE ER 50 MG PO TB24
50.0000 mg | ORAL_TABLET | Freq: Every day | ORAL | Status: DC
Start: 2022-04-13 — End: 2022-04-15
  Administered 2022-04-13 – 2022-04-15 (×3): 50 mg via ORAL
  Filled 2022-04-12 (×3): qty 1

## 2022-04-12 MED ORDER — ASPIRIN 81 MG PO TBEC
81.0000 mg | DELAYED_RELEASE_TABLET | Freq: Every day | ORAL | Status: DC
Start: 2022-04-13 — End: 2022-04-15
  Administered 2022-04-13 – 2022-04-15 (×3): 81 mg via ORAL
  Filled 2022-04-12 (×3): qty 1

## 2022-04-12 MED ORDER — NALOXONE HCL 0.4 MG/ML IJ SOLN (WRAP)
0.2000 mg | INTRAMUSCULAR | Status: DC | PRN
Start: 2022-04-12 — End: 2022-04-15

## 2022-04-12 MED ORDER — DEXTROSE 50 % IV SOLN
12.5000 g | INTRAVENOUS | Status: DC | PRN
Start: 2022-04-12 — End: 2022-04-15

## 2022-04-12 MED ORDER — MELATONIN 3 MG PO TABS
3.0000 mg | ORAL_TABLET | Freq: Every evening | ORAL | Status: DC | PRN
Start: 2022-04-12 — End: 2022-04-15

## 2022-04-12 MED ORDER — POTASSIUM & SODIUM PHOSPHATES 280-160-250 MG PO PACK
2.0000 | PACK | ORAL | Status: DC | PRN
Start: 2022-04-12 — End: 2022-04-12

## 2022-04-12 NOTE — Plan of Care (Signed)
NURSING SHIFT NOTE     Patient: Terri Fuller  Day: 0      SHIFT EVENTS   Pt was A&Ox4. VSS. Pt denies any pain. On RA SpO2 97%. No significant event overnight. Plan of care discussed with patient; all concerns addressed. Safety bundle maintained. Purposeful hourly rounding completed.         ASSESSMENT     Changes in assessment from patient's baseline this shift:    Neuro: No  CV: No  Pulm: No  Peripheral Vascular: No  HEENT: No  GI: No  BM during shift: No, Last BM: Last BM Date: 04/11/22  GU: No   Integ: No  MS: No    Pain: None  Pain Interventions: None  Medications Utilized: None    Mobility: PMP Activity: Step 3 - Bed Mobility of Distance Walked (ft) (Step 6,7): 0 Feet           Lines     Patient Lines/Drains/Airways Status       Active Lines, Drains and Airways       Name Placement date Placement time Site Days    External Urinary Catheter 04/12/22  0433  --  less than 1                         VITAL SIGNS     Vitals:    04/12/22 0404   BP: 132/77   Pulse: 67   Resp: 18   Temp: 97.7 F (36.5 C)   SpO2: 97%       Temp  Min: 97.7 F (36.5 C)  Max: 97.7 F (36.5 C)  Pulse  Min: 67  Max: 67  Resp  Min: 18  Max: 18  BP  Min: 132/77  Max: 132/77  SpO2  Min: 97 %  Max: 97 %    No intake or output data in the 24 hours ending 04/12/22 0511         CARE PLAN         Problem: Compromised Tissue integrity  Goal: Damaged tissue is healing and protected  Outcome: Progressing  Flowsheets (Taken 04/12/2022 0510)  Damaged tissue is healing and protected:   Monitor/assess Braden scale every shift   Reposition patient every 2 hours and as needed unless able to reposition self   Relieve pressure to bony prominences for patients at moderate and high risk   Keep intact skin clean and dry   Use incontinence wipes for cleaning urine, stool and caustic drainage. Foley care as needed   Monitor external devices/tubes for correct placement to prevent pressure, friction and shearing   Encourage use of lotion/moisturizer on skin    Utilize specialty bed   Consider placing an indwelling catheter if incontinence interferes with healing of stage 3 or 4 pressure injury   Monitor patient's hygiene practices   Use bath wipes, not soap and water, for daily bathing   Provide wound care per wound care algorithm  Goal: Nutritional status is improving  Outcome: Progressing  Flowsheets (Taken 04/12/2022 0510)  Nutritional status is improving:   Assist patient with eating   Collaborate with Clinical Nutritionist   Allow adequate time for meals   Include patient/patient care companion in decisions related to nutrition   Encourage patient to take dietary supplement(s) as ordered     Problem: Safety  Goal: Patient will be free from injury during hospitalization  Outcome: Progressing  Flowsheets (Taken 04/12/2022 0510)  Patient will be free  from injury during hospitalization:   Assess patient's risk for falls and implement fall prevention plan of care per policy   Use appropriate transfer methods   Include patient/ family/ care giver in decisions related to safety   Assess for patients risk for elopement and implement [place] per policy   Provide alternative method of communication if needed (communication boards, writing)   Ensure appropriate safety devices are available at the bedside   Hourly rounding   Provide and maintain safe environment  Goal: Patient will be free from infection during hospitalization  Outcome: Progressing  Flowsheets (Taken 04/12/2022 0510)  Free from Infection during hospitalization:   Assess and monitor for signs and symptoms of infection   Monitor all insertion sites (i.e. indwelling lines, tubes, urinary catheters, and drains)   Encourage patient and family to use good hand hygiene technique   Monitor lab/diagnostic results     Problem: Discharge Barriers  Goal: Patient will be discharged home or other facility with appropriate resources  Outcome: Progressing  Flowsheets (Taken 04/12/2022 0510)  Discharge to home or  other facility with appropriate resources:   Provide appropriate patient education   Provide information on available health resources   Initiate discharge planning     Problem: Psychosocial and Spiritual Needs  Goal: Demonstrates ability to cope with hospitalization/illness  Outcome: Progressing  Flowsheets (Taken 04/12/2022 0510)  Demonstrates ability to cope with hospitalizations/illness:   Encourage verbalization of feelings/concerns/expectations   Assist patient to identify own strengths and abilities   Encourage participation in diversional activity   Include patient/ patient care companion in decisions     Problem: Fluid and Electrolyte Imbalance/ Endocrine  Goal: Fluid and electrolyte balance are achieved/maintained  Outcome: Progressing  Flowsheets (Taken 04/12/2022 0510)  Fluid and electrolyte balance are achieved/maintained:   Monitor intake and output every shift   Assess for confusion/personality changes   Observe for seizure activity and initiate seizure precautions if indicated   Monitor for muscle weakness   Monitor daily weight   Monitor/assess lab values and report abnormal values  Goal: Adequate hydration  Outcome: Progressing  Flowsheets (Taken 04/12/2022 0510)  Adequate hydration:   Assess mucus membranes, skin color, turgor, perfusion and presence of edema   Assess for peripheral, sacral, periorbital and abdominal edema     Problem: Nutrition  Goal: Nutritional intake is adequate  Outcome: Progressing  Flowsheets (Taken 04/12/2022 0510)  Nutritional intake is adequate:   Monitor daily weights   Encourage/perform oral hygiene as appropriate   Encourage/administer dietary supplements as ordered (i.e. tube feed, TPN, oral, OGT/NGT, supplements)  Goal: Patient maintains weight  Outcome: Progressing  Goal: Food and/or nutrient delivery  Outcome: Progressing  Flowsheets (Taken 04/12/2022 0510)  Food and/or nutrient delivery: Feeding assistance

## 2022-04-12 NOTE — Progress Notes (Signed)
Nutritional Support Services  Nutrition Assessment    Terri Fuller 88 y.o. female   MRN: [id number]    Summary of Nutrition Recommendations:  1. Continue current diet, modify if labs stablalize     2. Add ONS    - one magic cup, PO, one time per day   Each Magic Cup provides an additional 290 kcal and 9 gm protein.    - one gelatine plus, PO, BID   Each Gelatein Plus provides 150 kcal and 20 gm protein     3. Monitor intakes    Document % meals consumed     4. Monitor weights     5. Monitor labs    Add banatrol when K+ is WNL  6. Monitor BG,    Initiate SS if pt BG unable to tighter control   -----------------------------------------------------------------------------------------------------------------  D/w RN                                                       Assessment Data:   Referral Source:RN screen   Reason for Referral: MST 3 (unsure wt loss, decreased appetite)    Nutrition: Met with pt at bedside with family member. Pt states that UI is 2-3 meals, tries for 3 but often only eats 2. Past month pt has been having severe diarrhea with decreased appetite. UI during this time consisted of mashed potatoes, yogurt, and apple sauce. Pt unsure if she has lost weight as UBW around 65.9 currently at 65.7 kg. Denied any N/V, wears dentures and would prefer the mechanical soft diet. Conducted NFPE, mild signs of los identified, pt also wheelchair bound x 11 years, more loss identified in [place]. Pt is low threshold for malnutrition will continue to monitor.  Per RN pt being checked for Cdiff.     Learning Needs: Discussed finding of NFPE and recommended adding ONS to assist with intakes, (pt dislikes Ensure only likes Boost). Discussed adding Banatrol to assist with stool thickening but will reassess when potassium levels are WNL.     Hospital Admission: An 88 yo female, transferred from [facility] [place] emergency department to [facility] due to electrolyte abnormalities including  hyperkalemia and hyponatremia (sodium 119 at outside facility) in the setting of prolonged diarrhea. She states that she started having diarrhea daily a month ago, 3-4 times a day.  The diarrhea has continued to be relentless.  She states that she has lost her appetite.  She has gotten weaker and weaker.     Medical Hx:  has a past medical history of Right hemiplegia and Wheelchair dependence.    PSH: has no past surgical history on file.     Orders Placed This Encounter   Procedures    Diet renal Protein restriction: 80 GM Protein; Additional restrictions: LOW LACTOSE; Patient preferences: mechanical soft diet    Magic Cup Quantity: A. One; Flavor: Chocolate (orange); Frequency: Once daily With dinner    Gelatein Plus Quantity: A. One; Flavor: Pineapple (cherry); Frequency: BID (2 times a day) With breakfast and lunch     Intake:None documented         ANTHROPOMETRIC  Height: 162.6 cm ('5\' 4"'$ )  Weight: 65.7 kg (144 lb 13.5 oz)                    Body  mass index is 24.86 kg/m.      Weight Monitoring     Weight Weight Method   04/12/2022 65.7 kg  Bed Scale      Weight History Summary: no other weights in chart. Pt stated UBW around 65.9 kg.       ESTIMATED NEEDS    Total Daily Energy Needs: 1642.5 to 1971 kcal  Method for Calculating Energy Needs: 25 kcal - 30 kcal per kg  at 65.7 kg (Actual body weight)  Rationale: non obese, non icu,       Total Daily Protein Needs: 65.7 to 78.84 g  Method for Calculating Protein Needs: 1 g - 1.2 g per kg at 65.7 kg (Actual body weight)  Rationale: non obese, non ICU      Total Daily Fluid Needs: 1642.5 to 1971 ml  Method for Calculating Fluid Needs: 1 ml per kcal energy = 1642.5 to 1971 kcal  Rationale: or per MD      Pertinent Medications: kayexalate, cal gluc, Insulin regular,     IVF:     dextrose 5 % 1,000 mL with sodium bicarbonate 100 mEq infusion 100 mL/hr (04/12/22 1145)       No Known Allergies      Pertinent labs:  Recent Labs   Lab 04/12/22  0931 04/12/22  0558   Sodium  128* 128*   Potassium 5.4* 6.8*   Chloride 102 104   CO2 17 13*   BUN 45.0* 50.0*   Creatinine 1.9* 1.9*   Glucose 332* 74   Calcium 8.4 8.9   Magnesium  --  1.6   Phosphorus 3.2  --    eGFR 25.2* 25.2*   WBC  --  6.31   Hematocrit  --  35.7   Hgb  --  11.5     Physical Assessment: 04/12/2022  Mild signs of loss expected with advanced age, pt also wheelchair bound suspect more loss in LE and has residual right 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   Head: temple region: slight depression with decrease in muscle tone/resistance (mild muscle loss - temporalis), orbital region: slightly bulged fat pads, ample bounce back, fluid retention may mask loss, dehydration may falsely appear as loss (no wasting observed), and buccal region: full, round, filled-out cheeks, ample bounce back of fat pads (no wasting observed)  Upper Body: clavicle bone region: clavicle may be visible, but not prominent, feel muscle tone/resistance (no wasting observed), shoulder and Acromion bone region: rounded, curves at shoulder/arm juncture, feel muscle tone/resistance (no wasting observed), upper arm region: some fat in pinch between fingers, but not ample (mild fat loss), and dorsal hand region: slight depression, decrease in muscle tone/resistance (moderate muscle loss - interosseous)  Lower Body: anterior thigh and patellar region: slight depression along inner/outer thigh, patella slightly prominent, feel decrease in muscle tone/resistance in quadriceps to the patella (moderate muscle loss - quadriceps), posterior calf region: some shape to the bulb, but not well-developed, decrease in muscle tone/resistance (moderate muscle loss - gastrocnemius), and edema: no sign of fluid accumulation  Skin: WDL per flow sheet  GI function:WDL abd soft non distended, +BS, last BM 10/19 per flow sheet  Nutrition Diagnosis      Inadequate Protein-energy intake related to decreased appetite as evidenced by  reports decreased intakes x 1 month. - new    Pt is at low threshold for malnutrition; will continue to monitor for 2 qualifying criteria.                                                             Intervention     Nutrition recommendation - Please refer to top of note                                                              Monitoring/Evaluation   Goals:   1. Patient to meet >75% of estimated needs through meals and ONS on f/u. - new  Nutrition Risk Level: High (will follow up at least 2 times per week and PRN)      Maurilio Lovely, [place] Dietitian  [phone]

## 2022-04-12 NOTE — H&P (Addendum)
Patient: Terri Fuller   88 y.o. female   DOB: 1932/11/18   MRN: [id number] Attending: Roney Jaffe, DO  CODE STATUS: full  PRIMARY CARE MD: Garnette Czech, MD      ASSESSMENT & PLAN  Hyponatremia.  Patient was noted to have electrolyte abnormalities with hyperkalemia and hyponatremia at [facility].  Indeed, the sodium level has come up considerably from 119 at outside facility to 128 now.  Ideally, we would not correct any further, as patient is already up 9 mEq in less than 24 hours.  Hyperkalemia.  Potassium is 6.8 here.  Calcium gluconate 1 g IV, insulin regular insulin 5 units IV, sodium bicarbonate 50 mEq IV, Kayexalate 30 g ordered.  Continue to monitor potassium levels.  Magnesium level is 1.6.  Renal insufficiency.  Creatinine is currently 1.9.  Baseline unknown.  Diarrhea.  Suspect infectious etiology in light of recent exposure to family member who returned from [place] with diarrhea.  Request to cultures.  Request infectious disease specialist consultation.  Feel that diarrhea contributed to electrolyte abnormalities.  Generalized weakness.  Likely secondary to diarrhea and decreased p.o. intake.  Feel that patient would benefit from physical therapy prior to discharge home, although she does have right hemiplegia and is wheelchair-bound.  Her left side is weaker than usual at this time due to generalized weakness.        Chief Complaint: Diarrhea, generalized weakness      History Gathered From: patient    History of Present Illness    Patient was transferred from [facility] [facility] [place] emergency department to [facility] due to electrolyte abnormalities including hyperkalemia and hyponatremia (sodium 119 at outside facility) in the setting of prolonged diarrhea.  Patient is a good historian.    Patient states that she lives at home with her son and grandchildren.  She states that her son and 2 of his children were in [place], returning a month ago.  Patient states that her  son had diarrhea.  She states that she started having diarrhea daily a month ago, 3-4 times a day.  The diarrhea has continued to be relentless.  She states that she has lost her appetite.  She has gotten weaker and weaker.  For this reason, patient presented to [place] emergency department. Patient states that she herself was on a course of amoxicillin jfor upper respiratory infection just prior to onset of diarrhea.    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  Patient states that her grandchildren roll her to the bathroom when she needs to go to the bathroom.  Then she stands up and pivots out of the wheelchair.  Patient denies any red blood in the stools or black stools.  She denies any nausea, vomiting.  She denies any dysphagia.  She states that she just does not have an appetite due to the diarrhea and everything running right through her whenever she eats.  Patient denies having had abdominal pain.    On exam, patient had difficulty lifting her left leg which she attributes to generalized weakness recently in the setting of diarrhea.  She has dense weakness on the right side due to her previous stroke.      REVIEW OF SYSTEMS  A comprehensive review of systems was completed and negative except for that presented in the history of present illness.      No Known Allergies  Past Medical History:   Diagnosis Date    Right hemiplegia     As a result of stroke    Wheelchair dependence        No past surgical history on file.    No medications prior to admission.       Family History   Problem Relation Age of Onset    Heart failure Mother     COPD Father        Social History     Tobacco Use    Smoking status: Former     Packs/day: 1.00     Years: 20.00     Additional pack years: 0.00     Total pack years: 20.00     Types: Cigarettes     Quit date: 9     Years since quitting: 49.8    Smokeless tobacco: Never   Substance  Use Topics    Alcohol use: Not Currently     Comment: Patient denies any history of excessive alcohol intake    Drug use: Never           Vital Signs (most recent):   Patient Vitals for the past 24 hrs:   BP Temp Temp src Pulse Resp SpO2   04/12/22 0655 128/74 98.3 F (36.8 C) Oral 61 18 97 %   04/12/22 0404 132/77 97.7 F (36.5 C) Oral 67 18 97 %        PHYSICAL EXAM  Estimated body mass index is 24.86 kg/m as calculated from the following:    Height as of this encounter: 1.626 m ('5\' 4"'$ ).    Weight as of this encounter: 65.7 kg (144 lb 13.5 oz).   Constitutional:  Well nourished, in no acute distress.  Head:  Normocephalic.  Atraumatic.  Eyes:  No conjunctival injection.  Anicteric sclera.  PERRL.  Ears, Nose, Mouth, and Throat: On external inspection, ears and nose are normal. Oral mucosa dry.  Neck:  Range of motion grossly intact.  No palpable masses.  Heart: Regular rate and rhythm.  No murmur, click, rub or gallop.   Lungs: Clear to auscultation bilaterally.  Symmetrical excursion.  Abdomen:  Soft.  Nondistended.  Nontender to palpation.  No peritoneal signs.  Bowel sounds present in all 4 quadrants.  Skin:  No rashes, no jaundice.  Extremities: No cyanosis, clubbing, or edema.  Neurologic: Dense right hemiplegia, 2/5 strength in the right leg.  Patient was able to lift her left leg slightly, but stated that she felt weak all over.  She was still able to use her left arm to roll over in the bed.  Alert, oriented X 3.  Good historian.  Psychiatric: Normal mood and affect.        LABS & IMAGING    Recent Results (from the past 24 hour(s))   CBC and differential    Collection Time: 04/12/22  5:58 AM   Result Value Ref Range    WBC 6.31 3.10 - 9.50 x10 3/uL    Hgb 11.5 11.4 - 14.8 g/dL    Hematocrit 35.7 34.7 - 43.7 %    Platelets 188 142 - 346 x10 3/uL    RBC 4.01 3.90 - 5.10 x10 6/uL    MCV 89.0 78.0 - 96.0 fL    MCH 28.7 25.1 - 33.5 pg    MCHC 32.2 31.5 - 35.8 g/dL    RDW 12 11 - 15 %    MPV 10.7 8.9 - 12.5  fL  Instrument Absolute Neutrophil Count 2.96 1.10 - 6.33 x10 3/uL    Neutrophils 46.8 None %    Lymphocytes Automated 39.6 None %    Monocytes 7.8 None %    Eosinophils Automated 4.6 None %    Basophils Automated 1.0 None %    Immature Granulocytes 0.2 None %    Nucleated RBC 0.0 0.0 - 0.0 /100 WBC    Neutrophils Absolute 2.96 1.10 - 6.33 x10 3/uL    Lymphocytes Absolute Automated 2.50 0.42 - 3.22 x10 3/uL    Monocytes Absolute Automated 0.49 0.21 - 0.85 x10 3/uL    Eosinophils Absolute Automated 0.29 0.00 - 0.44 x10 3/uL    Basophils Absolute Automated 0.06 0.00 - 0.08 x10 3/uL    Immature Granulocytes Absolute 0.01 0.00 - 0.07 x10 3/uL    Absolute NRBC 0.00 0.00 - 0.00 x10 3/uL   Comprehensive metabolic panel    Collection Time: 04/12/22  5:58 AM   Result Value Ref Range    Glucose 74 70 - 100 mg/dL    BUN 50.0 (H) 7.0 - 21.0 mg/dL    Creatinine 1.9 (H) 0.4 - 1.0 mg/dL    Sodium 128 (L) 135 - 145 mEq/L    Potassium 6.8 (HH) 3.5 - 5.3 mEq/L    Chloride 104 99 - 111 mEq/L    CO2 13 (L) 17 - 29 mEq/L    Calcium 8.9 7.9 - 10.2 mg/dL    Protein, Total 6.4 6.0 - 8.3 g/dL    Albumin 3.3 (L) 3.5 - 5.0 g/dL    AST (SGOT) 19 5 - 41 U/L    ALT 15 0 - 55 U/L    Alkaline Phosphatase 99 37 - 117 U/L    Bilirubin, Total 0.6 0.2 - 1.2 mg/dL    Globulin 3.1 2.0 - 3.6 g/dL    Albumin/Globulin Ratio 1.1 0.9 - 2.2    Anion Gap 11.0 5.0 - 15.0    eGFR 25.2 (A) >=60 mL/min/1.73 m2   Magnesium    Collection Time: 04/12/22  5:58 AM   Result Value Ref Range    Magnesium 1.6 1.6 - 2.6 mg/dL       '@LASTEKG'$ @    IMAGING:    No image results found.        The purpose of this note is to communicate optimally with other physicians, advanced care practitioners and appropriate medical staff involved in the care of this patient and facilitate management.  It is written using standard medical terminology.    SIGNATURE:    Roney Jaffe, DO

## 2022-04-12 NOTE — ACP (Advance Care Planning) (Addendum)
[facility]  ADVANCED CARE PLANNING PROGRESS NOTE      Patient: Terri Fuller  Date: 04/12/2022   LOS: 0  Admission Date: 04/12/2022   MRN: [id number]  Attending: Marshia Ly, MD         PURPOSE OF [facility]   Purpose: Advance care planning    Parties in Attendance: Attending physician, patient, RN    Decisional Capacity: Presents    SUBJECTIVE     I reviewed patient's medical condition and possible options for aggressive care including cardiac resuscitation, intubation, mechanical ventilation.    Also we discussed who should speak on patient's behalf should she be unable to do so.    After reviewing various outcomes and using a shared decision making model, following decisions were made;    Patient is full code  Son Terri Fuller has medical power of attorney  Patient lives with her daughter and granddaughter in [place]  Patient is Sales promotion account executive Witness and declines all forms of blood or blood product transfusion    GOALS OF CARE       Code Status: Full code  Advanced Care Planning Documents: NA    Time Spent on Advanced Care Planning: 51 mins    Signed,  Conni Knighton Olin Hauser, MD

## 2022-04-12 NOTE — UM Notes (Signed)
Order [id number]    04/12/22 0440  ADMIT TO INPATIENT  Once               Initial Review  Class: Inpatient  Level of Care:  Acute       Patient Name: Terri Fuller, Terri Fuller   Date of Birth [birth year]   MRN: [id number]          Summary:  Terri Fuller is a 88 y.o. female who "was transferred from [facility] emergency department to [facility] due to electrolyte abnormalities including hyperkalemia and hyponatremia (sodium 119 at outside facility) in the setting of prolonged diarrhea."    "Patient states that she lives at home with her son and grandchildren.  She states that her son and 2 of his children were in [place], returning a month ago.  Patient states that her son had diarrhea.  She states that she started having diarrhea daily a month ago, 3-4 times a day.  The diarrhea has continued to be relentless.  She states that she has lost her appetite.  She has gotten weaker and weaker."        Medical History:  Past Medical History:   Diagnosis Date    Right hemiplegia     As a result of stroke    Wheelchair dependence            Active Problems:  Patient Active Problem List   Diagnosis    Hyponatremia    Hyperkalemia    Acute renal failure with tubular necrosis    Functional quadriplegia    Diarrhea of presumed infectious origin           Care Day - 04/12/2022    Per H&P Note:  ASSESSMENT & PLAN  Hyponatremia.  Patient was noted to have electrolyte abnormalities with hyperkalemia and hyponatremia at [facility].  Indeed, the sodium level has come up considerably from 119 at outside facility to 128 now.  Ideally, we would not correct any further, as patient is already up 9 mEq in less than 24 hours.  Hyperkalemia.  Potassium is 6.8 here.  Calcium gluconate 1 g IV, insulin regular insulin 5 units IV, sodium bicarbonate 50 mEq IV, Kayexalate 30 g ordered.  Continue to monitor potassium levels.  Magnesium level is 1.6.  Renal insufficiency.  Creatinine is currently 1.9.  Baseline  unknown.  Diarrhea.  Suspect infectious etiology in light of recent exposure to family member who returned from [place] with diarrhea.  Request to cultures.  Request infectious disease specialist consultation.  Feel that diarrhea contributed to electrolyte abnormalities.  Generalized weakness.  Likely secondary to diarrhea and decreased p.o. intake.  Feel that patient would benefit from physical therapy prior to discharge home, although she does have right hemiplegia and is wheelchair-bound.  Her left side is weaker than usual at this time due to generalized weakness.            Vitals:  Patient Vitals for the past 24 hrs:   BP Temp Temp src Pulse Resp SpO2 Height Weight   04/12/22 1538 104/71 98.3 F (36.8 C) Oral 69 16 96 % -- --   04/12/22 1138 127/61 97.9 F (36.6 C) Oral 61 16 98 % -- --   04/12/22 1000 -- -- -- 64 18 98 % -- --   04/12/22 0940 -- -- -- 64 -- -- -- --   04/12/22 0655 128/74 98.3 F (36.8 C) Oral 61 18 97 % -- --   04/12/22  0404 132/77 97.7 F (36.5 C) Oral 67 18 97 % 1.626 m ('5\' 4"'$ ) 65.7 kg (144 lb 13.5 oz)       SpO2: 96 % (04/12/2022  3:38 PM)  O2 Device: None (Room air) (04/12/2022  3:38 PM)      Weight:   Recent Weights for the past 720 hrs (Last 3 readings):   Weight   04/12/22 0404 65.7 kg (144 lb 13.5 oz)           Labs:   Latest Reference Range & Units 04/12/22 05:58   BUN 7.0 - 21.0 mg/dL 50.0 (H)   Creatinine 0.4 - 1.0 mg/dL 1.9 (H)   Sodium 135 - 145 mEq/L 128 (L)   Potassium 3.5 - 5.3 mEq/L 6.8 (HH)   CO2 17 - 29 mEq/L 13 (L)   eGFR >=60 mL/min/1.73 m2 25.2 !   Albumin 3.5 - 5.0 g/dL 3.3 (L)      Latest Reference Range & Units 04/12/22 09:31   Glucose 70 - 100 mg/dL 332 (H)   BUN 7.0 - 21.0 mg/dL 45.0 (H)   Creatinine 0.4 - 1.0 mg/dL 1.9 (H)   Sodium 135 - 145 mEq/L 128 (L)   Potassium 3.5 - 5.3 mEq/L 5.4 (H)   eGFR >=60 mL/min/1.73 m2 25.2 !   Albumin 3.5 - 5.0 g/dL 2.9 (L)           Medications:    Current Facility-Administered Medications   Medication Dose Route  Frequency    albuterol  10 mg Nebulization Once    calcium GLUConate  1 g Intravenous Once    enoxaparin  40 mg Subcutaneous Daily         One Time Medications:            Continuous IV Infusions:   dextrose 5 % 1,000 mL with sodium bicarbonate 100 mEq infusion 100 mL/hr (04/12/22 1145)         Plan of Care:   -  ECG 12 lead  -  Place midline  -  IV Calcium Gluconate x once  -  IV Dextrose 10% bolus + IV Regular Insulin x once  -  IV Sodium Bicarbonate x once  -  Continuous IV Dextrose 5% with Sodium Bicarbonate infusion  -  ID consult  -  Telemetry monitoring            Melissa Montane RN, BSN  UR Case Manager  [email or web address]  Phone: [phone]  Fax: [phone]  4:26 PM  04/12/2022            UTILIZATION REVIEW CONTACT: Name:  Melissa Montane, RN BSN  Utilization Review  [facility]  Address:  [address], [place], [place] [address]  NPI:   [id number]  Tax ID:  [id number]  Phone: [phone], Option 2  Fax:  [phone]    Please use fax number [phone] to provide authorization for hospital services or to request additional information.        NOTES TO REVIEWER:    This clinical review is based on/compiled from documentation provided by the treatment team within the patient's medical record.

## 2022-04-12 NOTE — Procedures (Addendum)
MIDLINE INSERTION PROCEDURE     Youtsey,Ndidi  04/12/2022    INDICATIONS: Therapy less than 28 days    The midline procedure, risks, benefits were discussed with thepatient.  All questions were answered  patient verbalized understanding and agreed to proceed. Midline education/instructions provided to patient.    PROCEDURE DETAILS:   The patient was positioned and Ultrasound was used to confirm patency of the Left Brachial vein prior to obtaining venous access. The arm was scrubbed with 2% chlorhexidine per guidelines and a maximal sterile field was established for the patient.  The clinician was attired with cap, mask and sterile gown/gloves prior to start.     Arrow Single Lumen Power Midline:  A sterile cover was sheathed to the Ultrasound probe.  The vein was then revisualized and 1% lidocaine injected prior to puncture of the Left Brachial vein with a 21-gauge single-wall needle under direct sonographic guidance.  The guidewire was advanced through the needle, the needle was removed and a peel-away sheath was placed over the wire.  After measuring from the insertion site to the midline of the upper arm the catheter was trimmed to insure tip location below the axillary.  The catheter was then advanced through the peel-away sheath.  The sheath was removed.  The catheter was flushed with normal saline to confirm brisk blood return and capped.  The catheter was stabilized on the skin using a securement device.  Hemostatic guaze and sterile transparent occlusive dressing applied using aseptic technique.    Patient did tolerate the procedure well.   Catheter Type: Arrow Single Lumen Power Midline  Insertion Site: Left Brachial vein  Total length: 15cm  Internal Length:  10cm  External Length: 5cm  UAC: 31cm    Midline Reference #: [id number]  Midline Kit Lot#: [id number]  Midline Kit expiration date: 2023-06-01    Findings/Conclusions:  No signs of bleeding or symptoms of nerve irritation noted at time of  insertion procedure.    Tip location is below the level of the axilla in Brachial vein. Midline is ready for immediate use.    Midline is ready for immediate use    Jaye Saal Lawerance Cruel, RN

## 2022-04-12 NOTE — Plan of Care (Signed)
NURSING SHIFT NOTE     Patient: Terri Fuller  Day: 0      SHIFT EVENTS     Shift Narrative/Significant Events (PRN med administration, fall, RRT, etc.):   Pt A&Ox4. VSS. Midline placed. IVF continued per order. Pt had multiple BP during this shift. Pts K is now 5.1.   Safety and fall precautions remain in place. Purposeful rounding completed.          ASSESSMENT     Changes in assessment from patient's baseline this shift:    Neuro: No  CV: No  Pulm: No  Peripheral Vascular: No  HEENT: No  GI: No  BM during shift: No, Last BM: Last BM Date: 04/12/22  GU: No   Integ: No  MS: No    Pain:   Pain Interventions: None  Medications Utilized:     Mobility: PMP Activity: Step 3 - Bed Mobility of Distance Walked (ft) (Step 6,7): 0 Feet           Lines     Patient Lines/Drains/Airways Status       Active Lines, Drains and Airways       Name Placement date Placement time Site Days    Peripheral IV 04/11/22 20 G Anterior;Left;Proximal Forearm 04/11/22  --  Forearm  1    Midline IV 04/12/22 Anterior;Distal;Left Upper Arm 04/12/22  1119  Upper Arm  less than 1    External Urinary Catheter 04/12/22  0433  --  less than 1                         VITAL SIGNS     Vitals:    04/12/22 1538   BP: 104/71   Pulse: 69   Resp: 16   Temp: 98.3 F (36.8 C)   SpO2: 96%       Temp  Min: 97.7 F (36.5 C)  Max: 98.3 F (36.8 C)  Pulse  Min: 61  Max: 69  Resp  Min: 16  Max: 18  BP  Min: 104/71  Max: 132/77  SpO2  Min: 96 %  Max: 98 %      Intake/Output Summary (Last 24 hours) at 04/12/2022 1901  Last data filed at 04/12/2022 1700  Gross per 24 hour   Intake 480 ml   Output --   Net 480 ml            CARE PLAN        Problem: Compromised Tissue integrity  Goal: Damaged tissue is healing and protected  Outcome: Progressing  Flowsheets (Taken 04/12/2022 1254)  Damaged tissue is healing and protected:   Monitor/assess Braden scale every shift   Provide wound care per wound care algorithm   Reposition patient every 2 hours and as needed unless able  to reposition self   Increase activity as tolerated/progressive mobility   Relieve pressure to bony prominences for patients at moderate and high risk   Avoid shearing injuries   Keep intact skin clean and dry  Goal: Nutritional status is improving  Outcome: Progressing  Flowsheets (Taken 04/12/2022 1254)  Nutritional status is improving:   Assist patient with eating   Allow adequate time for meals     Problem: Safety  Goal: Patient will be free from injury during hospitalization  Outcome: Progressing  Flowsheets (Taken 04/12/2022 1254)  Patient will be free from injury during hospitalization:   Assess patient's risk for falls and implement fall prevention plan of care  per policy   Provide and maintain safe environment   Ensure appropriate safety devices are available at the bedside   Use appropriate transfer methods   Include patient/ family/ care giver in decisions related to safety   Hourly rounding  Goal: Patient will be free from infection during hospitalization  Outcome: Progressing  Flowsheets (Taken 04/12/2022 1254)  Free from Infection during hospitalization:   Assess and monitor for signs and symptoms of infection   Monitor lab/diagnostic results   Monitor all insertion sites (i.e. indwelling lines, tubes, urinary catheters, and drains)   Encourage patient and family to use good hand hygiene technique     Problem: Pain  Goal: Pain at adequate level as identified by patient  Outcome: Progressing  Flowsheets (Taken 04/12/2022 1254)  Pain at adequate level as identified by patient:   Identify patient comfort function goal   Reassess pain within 30-60 minutes of any procedure/intervention, per Pain Assessment, Intervention, Reassessment (AIR) Cycle     Problem: Side Effects from Pain Analgesia  Goal: Patient will experience minimal side effects of analgesic therapy  Outcome: Progressing     Problem: Discharge Barriers  Goal: Patient will be discharged home or other facility with appropriate resources  Outcome:  Progressing     Problem: Psychosocial and Spiritual Needs  Goal: Demonstrates ability to cope with hospitalization/illness  Outcome: Progressing     Problem: Fluid and Electrolyte Imbalance/ Endocrine  Goal: Fluid and electrolyte balance are achieved/maintained  Outcome: Progressing  Flowsheets (Taken 04/12/2022 1254)  Fluid and electrolyte balance are achieved/maintained:   Monitor intake and output every shift   Monitor/assess lab values and report abnormal values   Provide adequate hydration   Assess and reassess fluid and electrolyte status  Goal: Adequate hydration  Outcome: Progressing  Flowsheets (Taken 04/12/2022 1254)  Adequate hydration:   Assess mucus membranes, skin color, turgor, perfusion and presence of edema   Monitor and assess vital signs and perfusion     Problem: Nutrition  Goal: Nutritional intake is adequate  Outcome: Progressing  Flowsheets (Taken 04/12/2022 1254)  Nutritional intake is adequate:   Monitor daily weights   Allow adequate time for meals  Goal: Patient maintains weight  Outcome: Progressing  Goal: Food and/or nutrient delivery  Outcome: Progressing     Problem: Moderate/High Fall Risk Score >5  Goal: Patient will remain free of falls  Outcome: Progressing  Flowsheets (Taken 04/12/2022 0800)  High (Greater than 13):   LOW-Fall Interventions Appropriate for Low Fall Risk   LOW-Anticoagulation education for injury risk   MOD-Remain with patient during toileting   MOD-Use of assistive devices -Bedside Commode if appropriate   HIGH-Bed alarm on at all times while patient in bed   HIGH-Utilize chair pad alarm for patient while in the chair   HIGH-Apply yellow "Fall Risk" arm band   HIGH-Initiate use of floor mats as appropriate   HIGH-Consider use of low bed

## 2022-04-12 NOTE — Progress Notes (Signed)
Patient seen and examined.  Earlier seen by one of my colleagues.  Patient had a URTI 4 weeks ago for which she got a course of antibiotics.  After completing the antibiotics patient developed diarrhea 5 times a day which persisted till yesterday.  She was advised to go to the hospital a week ago but did not comply.    Last BM was yesterday morning.  Patient has had poor intake and has been drinking boost and fruit juice.  She is admitted with critical hyperkalemia dehydration and acute renal failure.    Discussed with RN and patient placed the patient on potassium restricted diet, telemetry monitor, IV fluids with hypotonic bicarb due to rapid correction of her sodium from 119  to 128.  Patient is alert oriented x3 but has dense right hemiplegia and has not walked since 11 years.    Patient is Jehovah witness and her son Terri Fuller is a medical power of attorney.  He is an EMT.  She has advanced directive not wanting any form of blood products.

## 2022-04-12 NOTE — Progress Notes (Signed)
Patient safely arrived to unit 21 at around 4am from [facility]. A&Ox4. VSS; SpO2 97%; denies pain;able to make needs known. Pt ID band verified and oriented to unit and safety policies.Skin assessment completed w/ additional RN.all belongings reviewed. Educated on use of incentive spirometer. Pt also educated on importance of call bell use for pain and toileting needs. All fall and safety precautions in place.

## 2022-04-13 LAB — GLUCOSE WHOLE BLOOD - POCT
Whole Blood Glucose POCT: 78 mg/dL (ref 70–100)
Whole Blood Glucose POCT: 122 mg/dL — ABNORMAL HIGH (ref 70–100)
Whole Blood Glucose POCT: 80 mg/dL (ref 70–100)
Whole Blood Glucose POCT: 112 mg/dL — ABNORMAL HIGH (ref 70–100)

## 2022-04-13 LAB — BASIC METABOLIC PANEL
Anion Gap: 12 (ref 5.0–15.0)
BUN: 40 mg/dL — ABNORMAL HIGH (ref 7.0–21.0)
CO2: 21 mEq/L (ref 17–29)
Calcium: 8.6 mg/dL (ref 7.9–10.2)
Chloride: 102 mEq/L (ref 99–111)
Creatinine: 1.5 mg/dL — ABNORMAL HIGH (ref 0.4–1.0)
Glucose: 78 mg/dL (ref 70–100)
Potassium: 4.7 mEq/L (ref 3.5–5.3)
Sodium: 135 mEq/L (ref 135–145)
eGFR: 33.5 mL/min/{1.73_m2} — AB (ref >=60)

## 2022-04-13 LAB — CBC
Absolute NRBC: 0 10*3/uL (ref 0.00–0.00)
Hematocrit: 32.6 % — ABNORMAL LOW (ref 34.7–43.7)
Hgb: 10.9 g/dL — ABNORMAL LOW (ref 11.4–14.8)
MCH: 29.1 pg (ref 25.1–33.5)
MCHC: 33.4 g/dL (ref 31.5–35.8)
MCV: 87.2 fL (ref 78.0–96.0)
MPV: 11.1 fL (ref 8.9–12.5)
Nucleated RBC: 0 /100 WBC (ref 0.0–0.0)
Platelets: 188 10*3/uL (ref 142–346)
RBC: 3.74 10*6/uL — ABNORMAL LOW (ref 3.90–5.10)
RDW: 13 % (ref 11–15)
WBC: 7.06 10*3/uL (ref 3.10–9.50)

## 2022-04-13 MED ORDER — SODIUM CHLORIDE 0.9 % IV SOLN
INTRAVENOUS | Status: DC
Start: 2022-04-13 — End: 2022-04-14

## 2022-04-13 NOTE — UM Notes (Signed)
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  She last had a bowel movement yesterday around lunchtime and has not had diarrhea since.       Vital Signs     98.3 F (36.8 C) 72 16 103/74 97 %       Medications  Scheduled Meds:  Current Facility-Administered Medications   Medication Dose Route Frequency    apixaban  2.5 mg Oral Q12H [facility]    aspirin EC  81 mg Oral Daily    cetirizine  5 mg Oral Daily    metoprolol succinate XL  50 mg Oral Daily    montelukast  10 mg Oral QHS     Continuous Infusions:   sodium chloride 75 mL/hr at 04/13/22 1312     PRN Meds:.acetaminophen **OR** acetaminophen, dextrose **OR** dextrose **OR** dextrose **OR** glucagon (rDNA), fluticasone, magnesium sulfate, melatonin, naloxone, ondansetron      Abnormal Labs   04/13/22 04:49   Hemoglobin 10.9 (L)   Hematocrit 32.6 (L)   RBC 3.74 (L)      04/13/22 04:49   BUN 40.0 (H)   Creatinine 1.5 (H)   eGFR 33.5 !         Plan of Care    Hyponatremia (04/12/2022)           Assessment: Electrolytes improving.  Sodium normalized.  Still looks severely dehydrated from long diarrhea of 3 to 4 weeks.  Sodium has been gradually corrected to normal as desired and patient's mentation has improved        Plan: Continue bicarb drip start on IV fluids with normal saline.         Functional quadriplegia (04/12/2022)           Assessment: Patient with known right hemiplegia.  Because of inability to move left lower extremity is unclear.  Patient is able to eat with her left hand        Plan: OT PT evaluation, patient will likely need SNF placement.  Discussed with patient and daughter both are in agreement.         Acute renal failure with tubular necrosis (04/12/2022)           Assessment: Renal failure is improving with IV fluids        Plan: Continue IV fluids for 24 hours         Diarrhea of presumed  infectious origin (04/12/2022)           Assessment: Stool work-up is negative for Shigella salmonella Campylobacter and E. coli        Plan: C. Difficile studies are still pending.  Continue isolation       History of paroxysmal atrial fibrillation  Patient is on Eliquis for this.  Currently EKG shows no normal sinus rhythm with bradycardia.     DVT Prophylaxis: Patient is on Eliquis     Addendum to note:  All labs, imaging studies, scheduled medications, p.o. and IV PRN medications with their stated doses were personally reviewed by me today and are included in this note above under respective heading where they are meaningful.  This statement ties in all reviewed labs and medications to this note.    Primary Coverage:  Payor: MEDICARE [employer] / Plan: [place] MED ADV [id number] / Product Type: MANAGED MEDICARE /  UTILIZATION REVIEW CONTACT: Laurice Record, RN  Utilization Review   [place]  [phone]  [phone]  Email: [email or web address]  Tax ID:  [id number]         NOTES TO REVIEWER:    This clinical review is based on/compiled from documentation provided by the treatment team within the patient's medical record.

## 2022-04-13 NOTE — Plan of Care (Signed)
NURSING SHIFT NOTE     Patient: Terri Fuller  Day: 1      SHIFT EVENTS     Shift Narrative/Significant Events (PRN med administration, fall, RRT, etc.):     Patient was alert and oriented. Calm and cooperative. Did not complain of pain or discomfort. No diarrhea. IV fluids ran as per order. Safety and fall precautions remain in place. Purposeful rounding completed.          ASSESSMENT     Changes in assessment from patient's baseline this shift:    Neuro: No  CV: No  Pulm: No  Peripheral Vascular: No  HEENT: No  GI: No  BM during shift: No, Last BM: Last BM Date: 04/12/22  GU: No   Integ: No  MS: No    Pain: None  Pain Interventions: None  Medications Utilized: none    Mobility: PMP Activity: Step 3 - Bed Mobility of Distance Walked (ft) (Step 6,7): 0 Feet           Lines     Patient Lines/Drains/Airways Status       Active Lines, Drains and Airways       Name Placement date Placement time Site Days    Midline IV 04/12/22 Anterior;Distal;Left Upper Arm 04/12/22  1119  Upper Arm  1    External Urinary Catheter 04/12/22  0433  --  1                         VITAL SIGNS     Vitals:    04/13/22 1516   BP: 103/74   Pulse: 72   Resp: 16   Temp: 98.3 F (36.8 C)   SpO2: 97%       Temp  Min: 97.5 F (36.4 C)  Max: 98.3 F (36.8 C)  Pulse  Min: 71  Max: 82  Resp  Min: 16  Max: 17  BP  Min: 103/74  Max: 125/80  SpO2  Min: 96 %  Max: 100 %      Intake/Output Summary (Last 24 hours) at 04/13/2022 1841  Last data filed at 04/13/2022 1839  Gross per 24 hour   Intake 1600.33 ml   Output 1475 ml   Net 125.33 ml            CARE PLAN      Problem: Compromised Tissue integrity  Goal: Damaged tissue is healing and protected  Outcome: Progressing  Flowsheets (Taken 04/12/2022 1254 by Rufina Falco, [place], RN)  Damaged tissue is healing and protected:   Monitor/assess Braden scale every shift   Provide wound care per wound care algorithm   Reposition patient every 2 hours and as needed unless able to reposition self   Increase activity  as tolerated/progressive mobility   Relieve pressure to bony prominences for patients at moderate and high risk   Avoid shearing injuries   Keep intact skin clean and dry  Goal: Nutritional status is improving  Outcome: Progressing  Flowsheets (Taken 04/12/2022 1254 by Rufina Falco, [place], RN)  Nutritional status is improving:   Assist patient with eating   Allow adequate time for meals     Problem: Safety  Goal: Patient will be free from injury during hospitalization  Outcome: Progressing  Flowsheets (Taken 04/12/2022 1254 by Jamesetta So, RN)  Patient will be free from injury during hospitalization:   Assess patient's risk for falls and implement fall prevention plan of care per policy   Provide and maintain safe  environment   Ensure appropriate safety devices are available at the bedside   Use appropriate transfer methods   Include patient/ family/ care giver in decisions related to safety   Hourly rounding     Problem: Pain  Goal: Pain at adequate level as identified by patient  Outcome: Progressing  Flowsheets (Taken 04/12/2022 1254 by Rufina Falco, [place], RN)  Pain at adequate level as identified by patient:   Identify patient comfort function goal   Reassess pain within 30-60 minutes of any procedure/intervention, per Pain Assessment, Intervention, Reassessment (AIR) Cycle     Problem: Side Effects from Pain Analgesia  Goal: Patient will experience minimal side effects of analgesic therapy  Outcome: Progressing  Flowsheets (Taken 04/13/2022 1840)  Patient will experience minimal side effects of analgesic therapy: Monitor/assess patient's respiratory status (RR depth, effort, breath sounds)     Problem: Fluid and Electrolyte Imbalance/ Endocrine  Goal: Fluid and electrolyte balance are achieved/maintained  Outcome: Progressing  Flowsheets (Taken 04/12/2022 1254 by Rufina Falco, [place], RN)  Fluid and electrolyte balance are achieved/maintained:   Monitor intake and output every shift   Monitor/assess lab values and  report abnormal values   Provide adequate hydration   Assess and reassess fluid and electrolyte status  Goal: Adequate hydration  Outcome: Progressing  Flowsheets (Taken 04/12/2022 1254 by Rufina Falco, [place], RN)  Adequate hydration:   Assess mucus membranes, skin color, turgor, perfusion and presence of edema   Monitor and assess vital signs and perfusion     Problem: Nutrition  Goal: Nutritional intake is adequate  Outcome: Progressing  Flowsheets (Taken 04/12/2022 1254 by Rufina Falco, [place], RN)  Nutritional intake is adequate:   Monitor daily weights   Allow adequate time for meals

## 2022-04-13 NOTE — Progress Notes (Signed)
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  She last had a bowel movement yesterday around lunchtime and has not had diarrhea since.    Fever No    Eating Yes  Cough No    Diarrhea No    Sleep Yes  Voiding Yes    MEDICATIONS     Current Facility-Administered Medications   Medication Dose Route Frequency    apixaban  2.5 mg Oral Q12H [facility]    aspirin EC  81 mg Oral Daily    cetirizine  5 mg Oral Daily    metoprolol succinate XL  50 mg Oral Daily    montelukast  10 mg Oral QHS       PHYSICAL EXAM     Vitals:    04/13/22 1134   BP: 105/83   Pulse: 77   Resp: 16   Temp: 98.1 F (36.7 C)   SpO2: 100%       Temperature: Temp  Min: 97.5 F (36.4 C)  Max: 98.3 F (36.8 C)  Pulse: Pulse  Min: 69  Max: 82  Respiratory: Resp  Min: 16  Max: 17  Non-Invasive BP: BP  Min: 104/71  Max: 125/80  Pulse Oximetry SpO2  Min: 96 %  Max: 100 %      Intake and Output Summary (Last 24 hours) at Date Time    Intake/Output Summary (Last 24 hours) at 04/13/2022 1207  Last data filed at 04/13/2022 0600  Gross per 24 hour   Intake 1868.33 ml   Output 2000 ml   Net -131.67 ml         GEN APPEARANCE: Normal; more awake and interactive.  HEENT: PERLA; Icterus No    NECK: Supple; No bruits  CVS: RRR, S1, S2; No M/G/R  LUNGS: Air Entry Equal; No Wheezes; No Rhonchi: No rales  ABD: Soft; Bowl Sounds; Yes  GU: Foley No    EXT: No edema; Pulses 2+ and intact  Skin exam: Cold No    NEURO: CN 2-12 intact; dense right-sided hemiparesis with spasticity, left lower extremity movements also decreased power appears to be 3/5, cognitively seems much better today      Exam done by Marshia Ly, MD on 04/13/22 at 12:07 PM      [facility]   Lab 04/13/22  0449 04/12/22  0558   WBC 7.06  6.31   RBC 3.74* 4.01   Hgb 10.9* 11.5   Hematocrit 32.6* 35.7   MCV 87.2 89.0   Platelets 188 188       Recent Labs   Lab 04/13/22  0449 04/12/22  1723 04/12/22  0931 04/12/22  0558   Sodium 135 136 128* 128*   Potassium 4.7 5.1 5.4* 6.8*   Chloride 102 105 102 104   CO2 '21 20 17 '$ 13*   BUN 40.0* 44.0* 45.0* 50.0*   Creatinine 1.5* 1.6* 1.9* 1.9*   Glucose 78 166* 332* 74   Calcium 8.6 8.8 8.4 8.9   Magnesium  --   --   --  1.6       Recent Labs   Lab  04/12/22  0931 04/12/22  0558   ALT  --  15   AST (SGOT)  --  19   Bilirubin, Total  --  0.6   Albumin 2.9* 3.3*   Alkaline Phosphatase  --  99         Microbiology Results (last 15 days)       Procedure Component Value Units Date/Time    Stool for Salm/Shig/Campy/Shiga PCR [id number] Collected: 04/12/22 1050    Order Status: Completed Specimen: Stool Updated: 04/12/22 1931     Stool Salmonella Species by PCR Negative     Stool Shigella Species/Enteroinvasive Escherichia coli PCR Negative     Stool Campylobacter jejunii/coli by PCR Negative     Stool Shiga Toxin by PCR Negative     Comment: Testing performed using a BDMax multiplex PCR panel which  detects bacterial DNA. A positive result does not necessarily  indicate the presence of viable organisms. Positive results  do not rule out co-infection with other organisms that are  not detected by this test. This panel does not differentiate  between Shigella spp. and Enteroinvasive Escherichia coli  (EIEC), which are closely related and may cause similar  illness.  A result of Shiga Toxin POSITIVE, Shigella/EIEC NEGATIVE is  most likely due to the presence of Shiga toxin-producing  E. coli (STEC). Shigella species isolated in the [place]  are rarely positive for Shiga Toxin.  Serotyping of Salmonella, Shigella, or Shiga Toxin DNA  positive samples will be performed by [facility] for public health  surveillance purposes.  Serotyping results are available upon request.                  RADIOLOGY     No results found.      ASSESSMENT/PLAN     Chief complaint: Terri Fuller is a 88 y.o. female admitted with Hyperkalemia      Patient Active Hospital Problem List:    Hyperkalemia (04/12/2022)     Hyponatremia (04/12/2022)           Assessment: Electrolytes improving.  Sodium normalized.  Still looks severely dehydrated from long diarrhea of 3 to 4 weeks.  Sodium has been gradually corrected to normal as desired and patient's mentation has improved        Plan: Continue bicarb drip start on IV fluids with normal saline.         Functional quadriplegia (04/12/2022)           Assessment: Patient with known right hemiplegia.  Because of inability to move left lower extremity is unclear.  Patient is able to eat with her left hand        Plan: OT PT evaluation, patient will likely need SNF placement.  Discussed with patient and daughter both are in agreement.         Acute renal failure with tubular necrosis (04/12/2022)           Assessment: Renal failure is improving with IV fluids        Plan: Continue IV fluids for 24 hours         Diarrhea of presumed infectious origin (04/12/2022)           Assessment: Stool work-up is negative for Shigella salmonella Campylobacter and E. coli        Plan: C. Difficile studies are still pending.  Continue isolation       History of paroxysmal atrial fibrillation  Patient is on Eliquis for this.  Currently EKG shows no  normal sinus rhythm with bradycardia.    DVT Prophylaxis: Patient is on Eliquis    Addendum to note:  All labs, imaging studies, scheduled medications, p.o. and IV PRN medications with their stated doses were personally reviewed by me today and are included in this note above under respective heading where they are meaningful.  This statement ties in all reviewed labs and medications to this note.       Signed,  Marshia Ly, MD  12:07 PM 04/13/2022

## 2022-04-13 NOTE — OT Eval Note (Signed)
Occupational Therapy Eval and Treatment Terri Fuller        Post Acute Care Therapy Recommendations   Discharge Recommendations:  Home with supervision, Home with home health OT, Home with home health PT (requires 24 hour supervision)- pt has 24 hour assist at home      If recommended discharge disposition is not available, patient will require the following assistance: SNF    DME needs IF patient is discharging home: Patient already has needed equipment, splinting for R hand/wrist to prevent further deformity    Therapy discharge recommendations may change with patient status.  Please refer to most recent note for up-to-date recommendations.    Unit: [place] UNIT 21  Bed: A2103/A2103-A      ___________________________________________________    Time of Evaluation and Treatment:  Time Calculation  OT Received On: 04/13/22  Start Time: 1354  Stop Time: 1436  Time Calculation (min): 42 min       Chart Review and Collaboration with Care Team: 5 minutes, not included in above time.    Evaluation: 24 minutes  Treatment: 18 minutes    OT Visit Number: 1    Consult received for Terri Fuller for OT Evaluation and Treatment.  Patient's medical condition is appropriate for Occupational therapy intervention at this time.    Activity Orders:  OT eval and treat and progressive mobility protocol    Precautions and Contraindications:  Precautions  Weight Bearing Status: no restrictions  Fall Risks: High, Impaired balance/gait, Impaired mobility    Personal Protective Equipment (PPE)  gloves and procedure mask    Medical Diagnosis:  Acute renal failure [N17.9]  Hyponatremia [E87.1]    History of Present Illness:  Terri Fuller is a 88 y.o. female admitted on 04/12/2022 with diarrhea X one month, 3-4 times a day, dec appetite and dec p.o. intake associated with increased weakness.  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    Patient Active Problem List   Diagnosis    Hyponatremia    Hyperkalemia    Acute  renal failure with tubular necrosis    Functional quadriplegia    Diarrhea of presumed infectious origin        Past Medical/Surgical History:  Past Medical History:   Diagnosis Date    Right hemiplegia     As a result of stroke    Wheelchair dependence      No past surgical history on file.     X-Rays/Tests/Labs  Lab Results   Component Value Date/Time    HGB 10.9 (L) 04/13/2022 04:49 AM    HCT 32.6 (L) 04/13/2022 04:49 AM    K 4.7 04/13/2022 04:49 AM    NA 135 04/13/2022 04:49 AM       All imaging reviewed. Please see chart for details      Social History:  Prior Level of Function  Prior level of function: Needs assistance with ADLs, Up to chair with assistance (no ambulation since CVA 2021)  Baseline Activity Level: No independent activity  Ambulated 100 feet or more prior to admission: No  Driving: does not drive  Dressing - Upper Body: minimal assist  Dressing - Lower Body: dependent, maximal assist  Cooking: No  Feeding: independent (with L UE/set up)  Bathing: maximal assist  Grooming: minimal assist  Toileting: minimal assist  Employment: Retired  DME Currently at [employer]: Radio broadcast assistant, Ambulance person, Radio broadcast assistant, IT trainer, ADL- 3-in-1 Bedside Commode    Home Living Arrangements  Living Arrangements: Family members  Type of Home: House  Home Layout: Able to live on main level with bedroom/bathroom  DME Currently at Home: Wheelchair, Manual, Wheelchair, Electric, ADL- 3-in-1 Bedside Commode      Subjective:      Patient is agreeable to participation in the therapy session. Family and/or guardian are agreeable to patient's participation in the therapy session. Nursing clears patient for therapy.      Pain Assessment  Pain Assessment: No/denies pain      Objective:   Inspection/Posture  Inspection/Posture: Pt in bed, family members at bedside- daughter and grandaughter  Observation of Patient/Vital Signs:Stable    Cognitive Status and Neuro Exam:  Cognition/Neuro Status  Arousal/Alertness: Appropriate responses to  stimuli  Attention Span: Appears intact  Orientation Level: Oriented X4  Memory: Appears intact  Following Commands: independent  Safety Awareness: minimal verbal instruction  Insights: Fully aware of deficits  Problem Solving: Assistance required to identify errors made  Behavior: cooperative, calm, attentive  Motor Planning: intact  Coordination: [place] impaired, [place] impaired (R UE due to CVA/hemiplegia)  Hand Dominance: right handed    Musculoskeletal Examination  Gross ROM  Right Upper Extremity ROM:  (impaired due to CVA 2021- right hand and wrist in flexed position- pt reports she used to have a splint)  Left Upper Extremity ROM: within functional limits  Right Lower Extremity ROM:  (decreased AROM throughout)  Left Lower Extremity ROM: within functional limits  Gross Strength  Right Upper Extremity Strength:  (not formally tested)  Left Upper Extremity Strength: 3/5  Right Lower Extremity Strength: 3-/5  Left Lower Extremity Strength: 3/5       Sensory/Oculomotor Examination  Sensory  Auditory: intact  Tactile - Light Touch: intact  Visual Acuity: wears glasses         Activities of Daily Living  Self-care and Home Management  Eating: Supervision, in bed, Setup  Functional Transfers: Moderate Assist (sit to stand)    Functional Mobility:  Mobility and Transfers  Supine to Sit: Moderate Assist, Increased Time, Increased Effort  Sit to Supine: Moderate Assist  Sit to Stand: Moderate Assist     Balance  Balance  Static Sitting Balance:  (fair)  Dyanamic Sitting Balance: poor  Static Standing Balance:  (fair)  Dynamic Standing Balance: poor    Participation and Activity Tolerance  Participation and Endurance  Participation Effort: good    Educated the Patient and Family to role of occupational therapy, plan of care, goals of therapy and HEP, safety with mobility and ADLs, home safety with verbalized understanding .    Patient left in bed with alarm and all other medical equipment in place and call bell and all  personal items/needs within reach.  RN notified of session outcome.        Assessment:  Terri Fuller is a 88 y.o. female admitted 04/12/2022. OT Assessment: decreased independence with ADLs;balance deficits;decreased strength;decreased ROM (dec functional mobility)      Treatment:  Educated pt and family on OT's role in acute care, discharge rec discussed with family after pt participated in bed mobility techniques, PROM R UE, sitting at EOB X 10 mins working on trunk control and balance, sit to stand X 4 with mod assist, able to stand X 15 second on last stand- daughter provided hand held assist to patient during sit to stand - returned to supine and repositioned in the bed ready for lunch tray- family in agreement with d/c rec.    Rehabilitation Potential: Prognosis: Good, With continued OT s/p  acute discharge    Plan:  OT Frequency Recommended: 1-2x/wk   Treatment Interventions: ADL retraining, Functional transfer training, UE strengthening/ROM, Patient/Family training, Equipment eval/education     PMP Activity: Step 3 - Bed Mobility  Distance Walked (ft) (Step 6,7): 0 Feet    Risks/benefits/POC discussed    Goals:  Time For Goal Achievement: by time of discharge  ADL Goals  Patient will groom self: Supervision, at edge of bed (to wash face)  Other Goal: Pt will complete bed mobility with min assist and bed rails  Mobility and Transfer Goals  Pt will transfer bed to chair: Minimal Assist (SPT)  Neuro Re-Ed Goals  Pt will sit at edge of bed: for 10 minutes, to prepare for OOB tasks, Supervision  Musculoskeletal Goals  Pt will perform Home Exercise Program: minimal assist, contact guard assist, with caregiver/family assist                     Terri Fuller OTR/L   04/13/2022 2:52 PM    [facility]  Patient: Terri Fuller MRN#: [id number]   Unit: [place] UNIT 21 Bed: [id number]

## 2022-04-13 NOTE — Plan of Care (Signed)
NURSING SHIFT NOTE     Patient: Terri Fuller  Day: 1      SHIFT EVENTS   Pt was A&Ox4. VSS. Pt denies any pain. On RA SpO2 96%. Pt able to swallow pills without difficulty.No significant event overnight. Plan of care discussed with patient; all concerns addressed. Safety bundle maintained. Purposeful hourly rounding completed.         ASSESSMENT     Changes in assessment from patient's baseline this shift:    Neuro: No  CV: No  Pulm: No  Peripheral Vascular: No  HEENT: No  GI: No  BM during shift: No, Last BM: Last BM Date: 04/12/22  GU: No   Integ: No  MS: No    Pain: None  Pain Interventions: None  Medications Utilized: None    Mobility: PMP Activity: Step 3 - Bed Mobility of Distance Walked (ft) (Step 6,7): 0 Feet           Lines     Patient Lines/Drains/Airways Status       Active Lines, Drains and Airways       Name Placement date Placement time Site Days    Peripheral IV 04/11/22 20 G Anterior;Left;Proximal Forearm 04/11/22  --  Forearm  2    Midline IV 04/12/22 Anterior;Distal;Left Upper Arm 04/12/22  1119  Upper Arm  less than 1    External Urinary Catheter 04/12/22  0433  --  less than 1                         VITAL SIGNS     Vitals:    04/12/22 2334   BP: 114/76   Pulse: 78   Resp: 16   Temp: 97.5 F (36.4 C)   SpO2: 96%       Temp  Min: 97.5 F (36.4 C)  Max: 98.3 F (36.8 C)  Pulse  Min: 61  Max: 78  Resp  Min: 16  Max: 18  BP  Min: 104/71  Max: 132/77  SpO2  Min: 96 %  Max: 98 %      Intake/Output Summary (Last 24 hours) at 04/13/2022 0107  Last data filed at 04/12/2022 2120  Gross per 24 hour   Intake 480 ml   Output 1700 ml   Net -1220 ml            CARE PLAN         Problem: Compromised Tissue integrity  Goal: Damaged tissue is healing and protected  Outcome: Progressing  Flowsheets (Taken 04/12/2022 1254 by Rufina Falco, [place], RN)  Damaged tissue is healing and protected:   Monitor/assess Braden scale every shift   Provide wound care per wound care algorithm   Reposition patient every 2 hours  and as needed unless able to reposition self   Increase activity as tolerated/progressive mobility   Relieve pressure to bony prominences for patients at moderate and high risk   Avoid shearing injuries   Keep intact skin clean and dry  Goal: Nutritional status is improving  Outcome: Progressing  Flowsheets (Taken 04/12/2022 1254 by Rufina Falco, [place], RN)  Nutritional status is improving:   Assist patient with eating   Allow adequate time for meals     Problem: Safety  Goal: Patient will be free from injury during hospitalization  Outcome: Progressing  Flowsheets (Taken 04/12/2022 1254 by Jamesetta So, RN)  Patient will be free from injury during hospitalization:   Assess patient's risk for falls and implement  fall prevention plan of care per policy   Provide and maintain safe environment   Ensure appropriate safety devices are available at the bedside   Use appropriate transfer methods   Include patient/ family/ care giver in decisions related to safety   [place] rounding  Goal: Patient will be free from infection during hospitalization  Outcome: Progressing  Flowsheets (Taken 04/12/2022 1254 by Jamesetta So, RN)  Free from Infection during hospitalization:   Assess and monitor for signs and symptoms of infection   Monitor lab/diagnostic results   Monitor all insertion sites (i.e. indwelling lines, tubes, urinary catheters, and drains)   Encourage patient and family to use good hand hygiene technique     Problem: Discharge Barriers  Goal: Patient will be discharged home or other facility with appropriate resources  Outcome: Progressing  Flowsheets (Taken 04/12/2022 0510)  Discharge to home or other facility with appropriate resources:   Provide appropriate patient education   Provide information on available health resources   Initiate discharge planning     Problem: Psychosocial and Spiritual Needs  Goal: Demonstrates ability to cope with hospitalization/illness  Outcome: Progressing  Flowsheets (Taken  04/12/2022 0510)  Demonstrates ability to cope with hospitalizations/illness:   Encourage verbalization of feelings/concerns/expectations   Assist patient to identify own strengths and abilities   Encourage participation in diversional activity   Include patient/ patient care companion in decisions     Problem: Fluid and Electrolyte Imbalance/ Endocrine  Goal: Fluid and electrolyte balance are achieved/maintained  Outcome: Progressing  Flowsheets (Taken 04/12/2022 1254 by Rufina Falco, [place], RN)  Fluid and electrolyte balance are achieved/maintained:   Monitor intake and output every shift   Monitor/assess lab values and report abnormal values   Provide adequate hydration   Assess and reassess fluid and electrolyte status     Problem: Nutrition  Goal: Nutritional intake is adequate  Outcome: Progressing  Flowsheets (Taken 04/12/2022 1254 by Rufina Falco, [place], RN)  Nutritional intake is adequate:   Monitor daily weights   Allow adequate time for meals     Problem: Moderate/High Fall Risk Score >5  Goal: Patient will remain free of falls  Outcome: Progressing  Flowsheets (Taken 04/12/2022 2000)  Moderate Risk (6-13):   MOD-Consider activation of bed alarm if appropriate   MOD-Apply bed exit alarm if patient is confused   MOD-Floor mat at bedside (where available) if appropriate   MOD-Consider a move closer to [place]   MOD-Remain with patient during toileting   MOD-Place bedside commode and assistive devices out of sight when not in use   MOD-Re-orient confused patients   MOD-Utilize diversion activities   MOD-Perform dangle, stand, walk (DSW) prior to mobilization   MOD-Request PT/OT consult order for patients with gait/mobility impairment   MOD-Use gait belt when appropriate   MOD-include family in multidisciplinary POC discussions

## 2022-04-14 LAB — RENAL FUNCTION PANEL
Albumin: 2.6 g/dL — ABNORMAL LOW (ref 3.5–5.0)
Anion Gap: 9 (ref 5.0–15.0)
BUN: 29 mg/dL — ABNORMAL HIGH (ref 7.0–21.0)
CO2: 20 mEq/L (ref 17–29)
Calcium: 7.9 mg/dL (ref 7.9–10.2)
Chloride: 109 mEq/L (ref 99–111)
Creatinine: 1.2 mg/dL — ABNORMAL HIGH (ref 0.4–1.0)
Glucose: 97 mg/dL (ref 70–100)
Phosphorus: 2.9 mg/dL (ref 2.3–4.7)
Potassium: 4.5 mEq/L (ref 3.5–5.3)
Sodium: 138 mEq/L (ref 135–145)
eGFR: 43.7 mL/min/{1.73_m2} — AB (ref >=60)

## 2022-04-14 LAB — GLUCOSE WHOLE BLOOD - POCT
Whole Blood Glucose POCT: 75 mg/dL (ref 70–100)
Whole Blood Glucose POCT: 120 mg/dL — ABNORMAL HIGH (ref 70–100)
Whole Blood Glucose POCT: 140 mg/dL — ABNORMAL HIGH (ref 70–100)
Whole Blood Glucose POCT: 103 mg/dL — ABNORMAL HIGH (ref 70–100)

## 2022-04-14 MED ORDER — APIXABAN 2.5 MG PO TABS
2.5000 mg | ORAL_TABLET | Freq: Two times a day (BID) | ORAL | 0 refills | Status: DC
Start: 2022-04-14 — End: 2022-04-15

## 2022-04-14 MED ORDER — ATORVASTATIN CALCIUM 80 MG PO TABS
80.0000 mg | ORAL_TABLET | Freq: Every day | ORAL | Status: DC
Start: 2022-04-14 — End: 2022-04-15
  Administered 2022-04-14 – 2022-04-15 (×2): 80 mg via ORAL
  Filled 2022-04-14 (×2): qty 1

## 2022-04-14 MED ORDER — ALBUTEROL SULFATE 1.25 MG/3ML IN NEBU
0.6250 mg | INHALATION_SOLUTION | Freq: Four times a day (QID) | RESPIRATORY_TRACT | Status: DC | PRN
Start: 2022-04-14 — End: 2022-04-14
  Filled 2022-04-14 (×4): qty 3

## 2022-04-14 MED ORDER — LEVALBUTEROL HCL 1.25 MG/3ML IN NEBU
1.2500 mg | INHALATION_SOLUTION | Freq: Three times a day (TID) | RESPIRATORY_TRACT | Status: DC
Start: 2022-04-15 — End: 2022-04-15
  Administered 2022-04-15: 1.25 mg via RESPIRATORY_TRACT

## 2022-04-14 MED ORDER — LEVALBUTEROL HCL 1.25 MG/3ML IN NEBU
1.2500 mg | INHALATION_SOLUTION | Freq: Three times a day (TID) | RESPIRATORY_TRACT | Status: DC | PRN
Start: 2022-04-14 — End: 2022-04-14
  Filled 2022-04-14 (×3): qty 3

## 2022-04-14 MED ORDER — PANTOPRAZOLE SODIUM 40 MG PO TBEC
40.0000 mg | DELAYED_RELEASE_TABLET | Freq: Every morning | ORAL | Status: DC
Start: 2022-04-15 — End: 2022-04-15
  Administered 2022-04-15: 40 mg via ORAL
  Filled 2022-04-14: qty 1

## 2022-04-14 NOTE — Discharge Instr - AVS First Page (Addendum)
Reason for your Hospital Admission:  Diarrhea, dehydration, renal failure      Instructions for after your discharge:  Your furosemide and Entresto have been held because of renal failure  Follow-up with nephrologist as an outpatient before restarting these medicines  Ensure fluid restriction of 45 ounces per day             Home Health Discharge Information    Your doctor has ordered Physical Therapy and Occupational Therapy in-home service(s) for you while you recuperate at home, to assist you in the transition from hospital to home.    The agency that you or your representative chose to provide the service:  [facility]   Phone:  [phone]     The above services were set up by:  Lyndee Leo, RN [facility] Liaison)   Phone: [phone]    IF YOU HAVE NOT HEARD [place] 24-48 HOURS AFTER DISCHARGE PLEASE CALL YOUR AGENCY TO ARRANGE A TIME FOR YOUR FIRST VISIT. FOR ANY SCHEDULING CONCERNS OR QUESTIONS RELATED TO HOME HEALTH, SUCH AS TIME OR DATE PLEASE CONTACT YOUR HOME HEALTH AGENCY AT THE NUMBER LISTED ABOVE.

## 2022-04-14 NOTE — Respiratory Progress Note (Signed)
Respiratory Therapy                              Patient Re-Assessment Note/Protocol Order Changes    Patient has been assessed and re-evaluated for the follow therapies:    IP Meds - Nasal and Inhaled (From admission, onward)      Start     Stop Status Route Frequency Ordered    04/14/22 2100  levalbuterol (XOPENEX) nebulizer solution 1.25 mg         -- Dispensed NEBULIZATION RT - Every 8 hours as needed 04/14/22 2038                 Current Criteria For Therapy  Secretion Clearance: None indicated  Lung Expansion: None indicated  Medications: Hx of COPD, Asthma, Bronchitis or other documented RAD, Home regimen (pt at baseline therapy)    Expected Outcomes          Meds: Return to baseline home regimen    Outcomes Met        Meds: Yes       Reassessment Recommendations  Recommendations: Increase current treatment plan    Patient's orders have been modified as follows per RT Patient Driven Protocol.  Per pt home regimen : 1.25 mg Xoponex neb tx TID.  <free text>    If any questions, please contact the Respiratory Therapist assigned to this patient    Thank you

## 2022-04-15 LAB — GLUCOSE WHOLE BLOOD - POCT
Whole Blood Glucose POCT: 78 mg/dL (ref 70–100)
Whole Blood Glucose POCT: 107 mg/dL — ABNORMAL HIGH (ref 70–100)

## 2022-04-15 MED ORDER — METOPROLOL SUCCINATE ER 50 MG PO TB24
50.0000 mg | ORAL_TABLET | Freq: Every day | ORAL | 0 refills | Status: AC
Start: 2022-04-15 — End: 2022-05-15

## 2022-04-15 MED ORDER — APIXABAN 2.5 MG PO TABS
2.5000 mg | ORAL_TABLET | Freq: Two times a day (BID) | ORAL | 0 refills | Status: AC
Start: 2022-04-15 — End: 2022-05-15

## 2022-04-15 MED ORDER — FLUTICASONE PROPIONATE 50 MCG/ACT NA SUSP
1.0000 | Freq: Every day | NASAL | 0 refills | Status: AC
Start: 2022-04-15 — End: 2022-05-15

## 2022-04-15 MED ORDER — OMEPRAZOLE 20 MG PO CPDR
20.0000 mg | DELAYED_RELEASE_CAPSULE | Freq: Two times a day (BID) | ORAL | 0 refills | Status: AC
Start: 2022-04-15 — End: 2022-05-15

## 2022-04-15 MED ORDER — LEVALBUTEROL HCL 0.63 MG/3ML IN NEBU
1.0000 | INHALATION_SOLUTION | Freq: Three times a day (TID) | RESPIRATORY_TRACT | 0 refills | Status: AC | PRN
Start: 2022-04-15 — End: 2022-05-15

## 2022-04-15 MED ORDER — ASPIRIN 81 MG PO TBEC
81.0000 mg | DELAYED_RELEASE_TABLET | Freq: Every day | ORAL | 0 refills | Status: AC
Start: 2022-04-15 — End: 2022-05-15

## 2022-04-15 MED ORDER — MONTELUKAST SODIUM 10 MG PO TABS
10.0000 mg | ORAL_TABLET | Freq: Every evening | ORAL | 0 refills | Status: AC
Start: 2022-04-15 — End: 2022-05-15

## 2022-04-15 MED ORDER — ATORVASTATIN CALCIUM 80 MG PO TABS
80.0000 mg | ORAL_TABLET | Freq: Every day | ORAL | 0 refills | Status: AC
Start: 2022-04-15 — End: 2022-05-15

## 2022-04-15 MED ORDER — FEXOFENADINE HCL 180 MG PO TABS
180.0000 mg | ORAL_TABLET | Freq: Every day | ORAL | 0 refills | Status: AC
Start: 2022-04-15 — End: 2022-05-15

## 2022-04-15 MED ORDER — DAPAGLIFLOZIN PROPANEDIOL 5 MG PO TABS
5.0000 mg | ORAL_TABLET | Freq: Every day | ORAL | 0 refills | Status: AC
Start: 2022-04-15 — End: 2022-05-15

## 2022-04-15 NOTE — Plan of Care (Addendum)
NURSING SHIFT NOTE     Patient: Terri Fuller  Day: 3    SHIFT EVENTS     Shift Narrative/Significant Events (PRN med administration, fall, RRT, etc.):   Pt denied having any pain during shift, no SOB or distress noted.  Pt denied having any loose bowel movements, and reports appetite is much better and improving.  Safety and fall precautions remain in place. Purposeful rounding completed.   RN will continue to monitor.       VITAL SIGNS     Vitals:    04/15/22 0430   BP: 143/89   Pulse: 69   Resp: 19   Temp: 98.6 F (37 C)   SpO2: 97%       Temp  Min: 97.6 F (36.4 C)  Max: 98.6 F (37 C)  Pulse  Min: 69  Max: 80  Resp  Min: 16  Max: 19  BP  Min: 111/65  Max: 143/89  SpO2  Min: 95 %  Max: 100 %      Intake/Output Summary (Last 24 hours) at 04/15/2022 0630  Last data filed at 04/15/2022 0430  Gross per 24 hour   Intake 360 ml   Output 1300 ml   Net -940 ml        CARE PLAN      Problem: Compromised Tissue integrity  Goal: Damaged tissue is healing and protected  Outcome: Progressing  Flowsheets (Taken 04/15/2022 0223)  Damaged tissue is healing and protected:   Monitor/assess Braden scale every shift   Reposition patient every 2 hours and as needed unless able to reposition self   Increase activity as tolerated/progressive mobility   Relieve pressure to bony prominences for patients at moderate and high risk   Avoid shearing injuries   Use bath wipes, not soap and water, for daily bathing   Keep intact skin clean and dry   Use incontinence wipes for cleaning urine, stool and caustic drainage. Foley care as needed   Monitor external devices/tubes for correct placement to prevent pressure, friction and shearing  Goal: Nutritional status is improving  Outcome: Progressing  Flowsheets (Taken 04/15/2022 0223)  Nutritional status is improving:   Assist patient with eating   Allow adequate time for meals   Encourage patient to take dietary supplement(s) as ordered   Include patient/patient care companion in decisions  related to nutrition   Collaborate with Clinical Nutritionist     Problem: Fluid and Electrolyte Imbalance/ Endocrine  Goal: Fluid and electrolyte balance are achieved/maintained  Outcome: Progressing  Flowsheets (Taken 04/15/2022 0223)  Fluid and electrolyte balance are achieved/maintained:   Monitor intake and output every shift   Monitor/assess lab values and report abnormal values   Assess for confusion/personality changes   Monitor daily weight   Observe for seizure activity and initiate seizure precautions if indicated   Monitor for muscle weakness   Provide adequate hydration   Observe for cardiac arrhythmias   Assess and reassess fluid and electrolyte status  Goal: Adequate hydration  Outcome: Progressing  Flowsheets (Taken 04/15/2022 0223)  Adequate hydration:   Assess mucus membranes, skin color, turgor, perfusion and presence of edema   Assess for peripheral, sacral, periorbital and abdominal edema   Monitor and assess vital signs and perfusion

## 2022-04-15 NOTE — Progress Notes (Signed)
Start [facility] Note  Home Health Referral    Referral from case management (Case Manager) for home health care upon discharge.    By [employer], the patient has the right to freely choose a home care provider.    A company of the patients choosing. We have supplied the patient with a listing of providers in your area who asked to be included and participate in Medicare.   [facility], formerly [facility], a home care agency that provides adult home care services and participates in Medicare   The preferred provider of your insurance company. Choosing a home care provider other than your insurance company's preferred provider may affect your insurance coverage.      Home Health Discharge Information    Your doctor has ordered Physical Therapy and Occupational Therapy in-home service(s) for you while you recuperate at home, to assist you in the transition from hospital to home.    The agency that you or your representative chose to provide the service:  [facility]   Phone:  [phone]     The above services were set up by:  Lyndee Leo, RN [facility] Liaison)   Phone: [phone]    IF YOU HAVE NOT HEARD [place] 24-48 HOURS AFTER DISCHARGE PLEASE CALL YOUR AGENCY TO ARRANGE A TIME FOR YOUR FIRST VISIT. FOR ANY SCHEDULING CONCERNS OR QUESTIONS RELATED TO HOME HEALTH, SUCH AS TIME OR DATE PLEASE CONTACT YOUR HOME HEALTH AGENCY AT THE NUMBER LISTED ABOVE.    Additional comments:        START PATIENT REGISTRATION INFORMATION     Order Information  Order Signing Physician: Marshia Ly, *    Service Ordered RN ?: No    Service Ordered PT ?: Yes  Service Ordered OT ?: Yes  Service Ordered ST ?: No    Service Ordered MSW?: No    Service Ordered HHA?: No    Following Physician: Garnette Czech, MD  Following Physician Phone: [phone]  Overseeing Physician: N/A  (Required for Residents only)   Agreeable to Follow?: Yes  Spoke with: N/A  Date/Time of  Call: 04/15/22 11:18 AM      Care Coordination   Same Day [facility]?: no  Primary Care Physician:Ursula Blima Ledger, MD  Primary Care Physician Phone:[phone]  Primary Care Physician Address: [address] [place] [address]  PCP NPI: [id number]  Visit Instructions: N/A  Service Discharge Location Type: [place] Name: N/A  Service Floor Facility: N/A  Service Room No: N/A    Demographics  Patient Last Name: Friedli   Patient First Name: Terri Fuller  Language/Communication Barrier: No  Service Address: [id number]  [place] [address]   Service Home Phone: [phone] (home)   Other phone numbers:    Telephone Information:   Mobile [phone]     Emergency Contact: Extended Emergency Contact Information  Primary Emergency Contact: HINES,TAMI  Mobile Phone: [phone]  Relation: Grandchild  Secondary Emergency Contact: [place]  Mobile Phone: [phone]  Relation: Son    Admission Information  Admit Date: 04/12/2022  Patient Status at discharge: Inpatient  Admitting Diagnosis: Acute renal failure [N17.9]  Hyponatremia [E87.1]     Caregiver Information  Caregiver First Name: N/A  Caregiver Last Name: N/A  Caregiver Relationship to Patient: N/A  Caregiver Phone Number: N/A  Caregiver Notes: N/A            Forensic scientist  Primary Insurance Information  Primary Subscriber:   Primary Subscriber Relation To Guarantor:   Primary Payor:   Primary Plan:   Primary Group #:    Primary Subscriber ID:    Primary Subscriber DOB:   Secondary Insurance Information  Secondary Subscriber:   Secondary Subscriber Relation To Guarantor:   Secondary Payor:   Secondary Plan:   Secondary Group #:   Secondary Subscriber ID:   Secondary Subscriber DOB:   HITECH  NO    END PATIENT REGISTRATION INFORMATION       Diagnosis:  Acute renal failure [N17.9]  Hyponatremia [E87.1]    [facility] Summary        Additional Comments:     End [facility] Summary     Discharge Date:  04/15/22    Referral Source  Signed  by: Lyndee Leo, RN  Date Time: 04/15/22 11:18 AM      End P[facility] Note      [place] face-to-face (FTF) Encounter (Order [id number])  Consult  Date: 04/15/2022 Department: [address] Released By: Lyndee Leo, RN (auto-released) Authorizing: Marshia Ly, MD     Order Information    Order Date/Time Release Date/Time Start Date/Time End Date/Time   04/15/22 11:17 AM 04/15/22 11:17 AM 04/15/22 11:15 AM 04/15/22 11:15 AM     Order Details    Frequency Duration Priority Order Class   Once 1  occurrence Routine Hospital Performed     Provider Information    Ordering User Ordering Provider Authorizing Provider   Homero Hyson, Thersa Salt, RN Koushik, Saul Fordyce, MD Marshia Ly, MD   Attending Provider(s) Admitting Provider PCP   Roney Jaffe, DO; Meredith Pel, MD; Marshia Ly, MD Roney Jaffe, DO Charlyne Mom Richardo Hanks, MD     Cosign Order Info    Action Created on Order Mode Entered by Responsible Provider Signed by Signed on   Ordering 04/15/22 1117 Telephone with Hadassah Pais, RN Koushik, Saul Fordyce, MD       Comments    Diagnosis:Hyponatremia E87.1   Hyperkalemia E87.5   Acute renal failure with tubular necrosis N17.0   Functional quadriplegia R53.2   Diarrhea of presumed infectious origin R19.7       Home PT/OT required for gait and balance training, strengthening, mobility, fall prevention, and ADL training.              Home Health face-to-face (FTF) Encounter: Patient Communication     Not Released  Not seen         Order Questions    Question Answer   Date I saw the patient face-to-face: 04/15/2022   Evidence this patient is homebound because: J.  Advanced age with frailty factors affecting safe ambulation & needs supervision    F.  Deconditioned due to advance disease process requiring assistance to leave home    G.  Fall risk due to impaired coordination, gait and decreased balance   Medical conditions that necessitate Home Health care: E.  Exacerbation of  disease requiring follow up monitoring    C.  Risk for complication/infection/pain requiring follow up and monitoring   Per clinical findings, following services are medically necessary: PT    OT   Clinical findings that support the need for Physical Therapy. PT will F.  Perform home safety assessment & develop safe in home exercise program    D.  Provide services to help restore function, mobility, and releive pain   OT will provide assistance with: Home  program to improve ability to perform ADLs    Basic motor function and reasoning abilities    Recovery and maintenance skills                    Process Instructions    Please select Home Care Services medically necessary.     Based on the above findings, I certify that this patient is confined to the home and needs intermittent skilled nursing care, physical therapry and / or speech therapy or continues to need occupational therapy. The patient is under my care, and I have initiated the establishment of the plan of care. This patient will be followed by a physician who will periodically review the plan of care.      Collection Information            Consult Order Info    ID Description Priority Start Date Start Time   [id number] [place] face-to-face (FTF) Encounter Routine 04/15/2022 11:15 AM   Provider Specialty Referred to   ______________________________________ _____________________________________                         Mickle Mallory Order Info    Action Created on Order Mode Entered by Responsible Provider Signed by Signed on   Ordering 04/15/22 1117 Telephone with Hadassah Pais, RN Koushik, Saul Fordyce, MD         Completion Info    User Date/Time   Lyndee Leo, RN 04/15/22 1117         Patient Information    Patient Name   Stevan Born Legal Sex   Female DOB   [birth year]       Reprint Order [place] face-to-face (FTF) Encounter (Order [id number]) on 04/15/22       Additional Information    Associated Reports External References    View Parent Encounter [facility]   Priority and Order Details

## 2022-04-15 NOTE — Progress Notes (Signed)
[name] home with home health. The Pt will be transported home by her granddaughter. Bedside and charge nurse have been notified.        04/15/22 1118   Discharge Disposition   Patient preference/choice provided? Yes   Physical Discharge Disposition Home, Home Health   Mode of Transportation Car   Patient/Family/POA notified of transfer plan Yes   Patient agreeable to discharge plan/expected d/c date? Yes   Family/POA agreeable to discharge plan/expected d/c date? Yes   Bedside nurse notified of transport plan? Yes   Medicare Checklist   Is this a Medicare patient? Yes   Patient received 1st IMM Letter? Yes     Casey Burkitt, MSW, Supervisee in Social Work, Tourist information centre manager I

## 2022-04-15 NOTE — Discharge Summary (Signed)
[facility]      Patient: Terri Fuller  Admission Date: 04/12/2022   DOB: Oct 25, 1932  Discharge Date: 04/15/2022   MRN: [id number]  Discharge Attending:Enrrique Mierzwa Olin Hauser, MD     Referring Physician: Garnette Czech, MD  PCP: Garnette Czech, MD       DISCHARGE SUMMARY     Discharge Information   Admission Diagnosis:   Hyperkalemia    Discharge Diagnosis:   Active Hospital Problems    Diagnosis    Hyponatremia     Priority: High    Hyperkalemia     Priority: High    Functional quadriplegia     Priority: High    Acute renal failure with tubular necrosis     Priority: Medium    Diarrhea of presumed infectious origin        Admission Condition: Guarded  Discharge Condition: Stable  Consultants: None  Functional Status: Patient is nonambulatory at baseline  Discharged to: Home with home health  Procedures:    Imaging:     No results found.   Echo Results       None          Discharge Medications:     Medication List        CHANGE how you take these medications      apixaban 2.5 MG  Commonly known as: ELIQUIS  Take 1 tablet (2.5 mg) by mouth every 12 (twelve) hours  What changed:   medication strength  how much to take  when to take this  additional instructions     metoprolol succinate XL 50 MG 24 hr tablet  Commonly known as: TOPROL-XL  What changed: Another medication with the same name was removed. Continue taking this medication, and follow the directions you see here.            CONTINUE taking these medications      aspirin EC 81 MG EC tablet  Generic drug: aspirin EC     atorvastatin 80 MG tablet  Commonly known as: LIPITOR     DAPAGLIFLOZIN PROPANEDIOL PO     fexofenadine 180 MG tablet  Commonly known as: ALLEGRA     fluticasone 50 MCG/ACT nasal spray  Commonly known as: FLONASE     levalbuterol 0.63 MG/3ML nebulizer solution  Commonly known as: XOPENEX     montelukast 10 MG tablet  Commonly known as: SINGULAIR     omeprazole 20 MG capsule  Commonly known as: PriLOSEC            STOP taking these  medications      Entresto 49-51 MG Tabs per tablet  Generic drug: sacubitril-valsartan     ENTRESTO PO     furosemide 40 MG tablet  Commonly known as: LASIX               Where to Get Your Medications        Information about where to get these medications is not yet available    Ask your nurse or doctor about these medications  apixaban 2.5 [place] / Hospital Course (3 Days):    Patient was transferred from [facility] emergency department to [facility] due to electrolyte abnormalities including hyperkalemia and hyponatremia (sodium 119 at outside facility) in the setting of prolonged diarrhea.  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  She states that her son and 2 of his children were in [place], returning a month ago.  Patient states that her son had diarrhea.  She states that she started having diarrhea daily a month ago, 3-4 times a day. Patient states that she herself was on a course of amoxicillin jfor upper respiratory infection just prior to onset of diarrhea.  Stool work-up was negative for Salmonella Shigella E. coli Campylobacter.  C. difficile test was ordered but is not clear whether it was sent and processed.  However diarrhea has resolved and C. difficile is not a likely diagnosis.    Patient was hydrated and treated for hyperkalemia.  Renal function and electrolytes normalized.  Her mentation improved considerably and patient was able to stand with assistance with physical therapy and they recommended that she would go home with home health.  Patient already has 24-hour care at home. 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     Problems addressed during this hospitalization are further enumerated below:    Hyperkalemia (04/12/2022)     Hyponatremia (04/12/2022)           Assessment: Electrolytes, renal function  improved.  Still looks severely dehydrated from long diarrhea of 3 to 4 weeks.  Sodium has been gradually corrected to normal as desired and patient's mentation has improved        Plan: Discontinue IV fluids.  Anticipate discharge in a.m.         Functional quadriplegia (04/12/2022)           Assessment: Patient with known right hemiplegia.  Because of inability to move left lower extremity is unclear.  Patient is able to eat with her left hand        Plan: OT PT evaluation, showed patient to be fairly high performance status despite her disability.  PT has recommended home with home health.  Discussed with patient and daughter both are in agreement.         Acute renal failure with tubular necrosis (04/12/2022)           Assessment: Renal failure is improving with IV fluids        Plan: Continue IV fluids for 24 hours         Diarrhea of presumed infectious origin (04/12/2022)           Assessment: Stool work-up is negative for Shigella salmonella Campylobacter and E. coli        Plan: C. Difficile studies are still pending.  Continue isolation, , however C. difficile diarrhea is unlikely given that patient's bowel symptoms have improved without treatment.       History of paroxysmal atrial fibrillation  Patient is on Eliquis for this.  Currently EKG shows no normal sinus rhythm with bradycardia.     DVT Prophylaxis: Patient is on Eliquis  ADDENDUM TO DISCHARGE SUMMARY  - Today's labs/imaging studies and ongoing medications were reviewed by me as of time this note is prepared  - In case patient does not leave facility all treatment including PO and IV PRN meds reviewed above will be continued in hospital  - Patient was informed of abnormal and incidental imaging findings during hospitalization, and advised to review this information with their  medical provider.               Progress Note/Physical Exam at Discharge     Subjective: Patient reported feeling well, and is  ready for discharge.    Vitals:    04/14/22  2309 04/15/22 0300 04/15/22 0430 04/15/22 0734   BP: 136/55  143/89 125/76   Pulse: 71 73 69 69   Resp: '18  19 17   '$ Temp: 98.5 F (36.9 C)  98.6 F (37 C) 97.9 F (36.6 C)   TempSrc: Oral  Oral Oral   SpO2: 95%  97% 98%   Weight:   67.9 kg (149 lb 11.1 oz)    Height:           General: NAD  HEENT: sclera anicteric, OP: Clear, MMM  Cardiovascular: RRR, no m/r/g  Lungs: CTAB, no w/r/r  Abdomen: soft, +BS, NT/ND, no masses, no g/r  Extremities: Warm and well perfused  Skin: no rashes or lesions noted on exposed surfaces  Neuro: Answers questions appropriately, responds to commands       Diagnostics     Labs/Studies Pending at Discharge:    [place]   Lab 04/13/22  0449 04/12/22  0558   WBC 7.06 6.31   RBC 3.74* 4.01   Hgb 10.9* 11.5   Hematocrit 32.6* 35.7   MCV 87.2 89.0   Platelets 188 188       Recent Labs   Lab 04/14/22  1330 04/13/22  0449 04/12/22  1723 04/12/22  0931 04/12/22  0558   Sodium 138 135 136 128* 128*   Potassium 4.5 4.7 5.1 5.4* 6.8*   Chloride 109 102 105 102 104   CO2 '20 21 20 17 '$ 13*   BUN 29.0* 40.0* 44.0* 45.0* 50.0*   Creatinine 1.2* 1.5* 1.6* 1.9* 1.9*   Glucose 97 78 166* 332* 74   Calcium 7.9 8.6 8.8 8.4 8.9   Magnesium  --   --   --   --  1.6       Microbiology Results (last 15 days)       Procedure Component Value Units Date/Time    Stool for Salm/Shig/Campy/Shiga PCR [id number] Collected: 04/12/22 1050    Order Status: Completed Specimen: Stool Updated: 04/12/22 1931     Stool Salmonella Species by PCR Negative     Stool Shigella Species/Enteroinvasive Escherichia coli PCR Negative     Stool Campylobacter jejunii/coli by PCR Negative     Stool Shiga Toxin by PCR Negative     Comment: Testing performed using a BDMax multiplex PCR panel which  detects bacterial DNA. A positive result does not necessarily  indicate the presence of viable organisms. Positive results  do not rule out co-infection with other organisms that are  not detected by this test. This panel does not  differentiate  between Shigella spp. and Enteroinvasive Escherichia coli  (EIEC), which are closely related and may cause similar  illness.  A result of Shiga Toxin POSITIVE, Shigella/EIEC NEGATIVE is  most likely due to the presence of Shiga toxin-producing  E. coli (STEC). Shigella species isolated in the [place]  are rarely positive for Shiga Toxin.  Serotyping of Salmonella, Shigella, or Shiga Toxin DNA  positive samples will be performed by [facility] for public health  surveillance purposes.  Serotyping results are available upon request.                  Patient Instructions   Discharge Diet: Heart healthy  Discharge Activity: As tolerated  LABS/TESTING recommended after discharge    Follow Up Appointment:   [facility]  [place]. Schedule an appointment as soon as possible for a visit in 1 week(s).    Specialty: Family Medicine  Contact information:  [address]  [address]  [place] [id number]  [phone]  Additional information:  From [address]. Go west on [address]. Go through the intersection with [address]. Destination is on your left.      From [place] on [address] past [address]. Destination is on your right.             Dalbert Mayotte, MD. Schedule an appointment as soon as possible for a visit in 1 month(s).    Specialties: Nephrology, Internal Medicine  Contact information:  [address] [place] [address]  [phone]                              Time spent examining patient, discussing with patient/family regarding hospital course, chart review, reconciling medications and discharge planning:  34 minutes.  This patient was examined by me on , the day of discharge.    Signed,  Marshia Ly, MD    7:40 AM 04/15/2022

## 2022-04-15 NOTE — Plan of Care (Addendum)
NURSING SHIFT NOTE     Patient: Terri Fuller  Day: 3      SHIFT EVENTS     Shift Narrative/Significant Events (PRN med administration, fall, RRT, etc.):   tient was discharged home from [address], transported by family. IV and telemetry were removed. VSS. Medication, discharge and follow-up instructions were given to the patient and family. All questions and concerns were answered. Medication prescriptions were given to the patient and sent to pharmacy. All belongings were with the patient upon discharge. The pt was able to teach back education.  Safety and fall precautions remain in place. Purposeful rounding completed.          ASSESSMENT     Changes in assessment from patient's baseline this shift:    Neuro: No  CV: No  Pulm: No  Peripheral Vascular: No  HEENT: No  GI: No  BM during shift: No, Last BM: Last BM Date: 04/13/22  GU: No   Integ: No  MS: No  Pain: Improved  Pain Interventions: Medications and Rest  Medications Utilized: none  Mobility: PMP Activity: Step 3 - Bed Mobility of Distance Walked (ft) (Step 6,7): 0 Feet           Lines     Patient Lines/Drains/Airways Status       Active Lines, Drains and Airways       None                         VITAL SIGNS     Vitals:    04/15/22 1152   BP: 127/81   Pulse: 77   Resp: 18   Temp: 98.1 F (36.7 C)   SpO2: 100%       Temp  Min: 97.6 F (36.4 C)  Max: 98.6 F (37 C)  Pulse  Min: 69  Max: 80  Resp  Min: 16  Max: 19  BP  Min: 124/80  Max: 143/89  SpO2  Min: 95 %  Max: 100 %      Intake/Output Summary (Last 24 hours) at 04/15/2022 1630  Last data filed at 04/15/2022 1152  Gross per 24 hour   Intake 120 ml   Output 1050 ml   Net -930 ml            CARE PLAN        Problem: Compromised Tissue integrity  Goal: Damaged tissue is healing and protected  Outcome: Progressing  Flowsheets (Taken 04/15/2022 1155)  Damaged tissue is healing and protected:   Monitor/assess Braden scale every shift   Provide wound care per wound care algorithm   Reposition patient every 2  hours and as needed unless able to reposition self   Increase activity as tolerated/progressive mobility   Relieve pressure to bony prominences for patients at moderate and high risk   Avoid shearing injuries   Keep intact skin clean and dry   Use incontinence wipes for cleaning urine, stool and caustic drainage. Foley care as needed   Use bath wipes, not soap and water, for daily bathing   Monitor external devices/tubes for correct placement to prevent pressure, friction and shearing   Encourage use of lotion/moisturizer on skin   Monitor patient's hygiene practices   Consult/collaborate with wound care nurse   Utilize specialty bed   Consider placing an indwelling catheter if incontinence interferes with healing of stage 3 or 4 pressure injury  Goal: Nutritional status is improving  Outcome: Progressing  Problem: Safety  Goal: Patient will be free from injury during hospitalization  Outcome: Progressing  Flowsheets (Taken 04/15/2022 1155)  Patient will be free from injury during hospitalization:   Assess patient's risk for falls and implement fall prevention plan of care per policy   Provide and maintain safe environment   Use appropriate transfer methods   Ensure appropriate safety devices are available at the bedside   Include patient/ family/ care giver in decisions related to safety   Hourly rounding   Assess for patients risk for elopement and implement [place] per policy   Provide alternative method of communication if needed (communication boards, writing)  Goal: Patient will be free from infection during hospitalization  Outcome: Progressing     Problem: Fluid and Electrolyte Imbalance/ Endocrine  Goal: Fluid and electrolyte balance are achieved/maintained  Outcome: Progressing  Flowsheets (Taken 04/15/2022 1155)  Fluid and electrolyte balance are achieved/maintained:   Monitor intake and output every shift   Monitor/assess lab values and report abnormal values   Provide adequate hydration    Assess for confusion/personality changes   Monitor daily weight   Assess and reassess fluid and electrolyte status   Observe for seizure activity and initiate seizure precautions if indicated   Observe for cardiac arrhythmias   Monitor for muscle weakness  Goal: Adequate hydration  Outcome: Progressing     Problem: Nutrition  Goal: Nutritional intake is adequate  Outcome: Progressing  Flowsheets (Taken 04/15/2022 1155)  Nutritional intake is adequate:   Monitor daily weights   Assist patient with meals/food selection   Allow adequate time for meals   Encourage/perform oral hygiene as appropriate   Encourage/administer dietary supplements as ordered (i.e. tube feed, TPN, oral, OGT/NGT, supplements)   Consult/collaborate with Clinical Nutritionist   Include patient/patient care companion in decisions related to nutrition   Assess anorexia, appetite, and amount of meal/food tolerated   Consult/collaborate with Speech Therapy (swallow evaluations)  Goal: Patient maintains weight  Outcome: Progressing  Goal: Food and/or nutrient delivery  Outcome: Progressing     Problem: Moderate/High Fall Risk Score >5  Goal: Patient will remain free of falls  Outcome: Progressing  Flowsheets (Taken 04/15/2022 1155)  Moderate Risk (6-13):   MOD-Consider activation of bed alarm if appropriate   MOD-Apply bed exit alarm if patient is confused   MOD-Floor mat at bedside (where available) if appropriate   MOD-Consider a move closer to [place]   MOD-Remain with patient during toileting   MOD-Place bedside commode and assistive devices out of sight when not in use   MOD-Re-orient confused patients   MOD-Utilize diversion activities   MOD-Perform dangle, stand, walk (DSW) prior to mobilization   MOD-Request PT/OT consult order for patients with gait/mobility impairment   MOD-Use gait belt when appropriate   MOD-include family in multidisciplinary POC discussions

## 2022-04-15 NOTE — Consults (Addendum)
Arrie Eastern. Lucio Edward, MD  Elio Forget, MD  Dory Larsen, MD, FASN, FACP  Laure Kidney, ANP-BC  Lorene Dy, FNP-BC    NEPHROLOGY CONSULTATION NOTE    Date Time: 04/15/22 12:41 PM  Patient Name: Terri Fuller,Terri Fuller  Attending Physician: Marshia Ly, *      Reason for Consultation:   AKI    Assessment/plan:   AKI stage 2, unclear if patient has baseline CKD. Likely in the setting of pre-renal from volume depletion- may have progressed to ATN  - renal function improving after patient received IV fluids. She was encouraged to increase oral intake. She was given information for foods and mediations (NSAIDs) to avoid.   - Monitor I&Os  - Avoid nephrotoxic agents ie NSAIDs  - Avoid hypotension, keep SBP >110 mmHg for adequate renal perfusion     Hyponatremia, secondary to volume depletion. Resolved.   - volume related, back to baseline  Hyperkalemia, resolved  Diarrhea, resolved  Afib  - rate controlled with metoprolol and AC with eliquis     *Patient will follow up with our office in 1 week.     History of Present Illness:   Terri Fuller is a 88 y.o. female With past medical history of CVA with right-sided hemiaplasia, paroxysmal afib who presented as a transfer from [facility] on 04-12-2022 with electrolyte abnormalities including hyperkalemia, hyponatremia and prolonged diarrhea.    Nephrology was consulted for AKI management and posthospital follow-up. On presentation to outside hospital sodium was 119, patient was on normal saline with bicarb, sodium improved to 128 the following day and is now at 138.  Creatinine on presentation was 1.9 which is now improved to 1.2.  Unclear baseline creatinine. No episodes of hypotension or contrast studies during this admission.  Patient reports she has good family support at home and was seen in the room today with her granddaughter present    Past Medical History:     Past Medical History:   Diagnosis Date    Right hemiplegia     As a result of stroke     Wheelchair dependence        Past Surgical History:   No past surgical history on file.    Allergies:   No Known Allergies    Medications:     Medications Prior to Admission   Medication Sig    [DISCONTINUED] apixaban (ELIQUIS) 5 MG Take 1 tablet (5 mg) by mouth Information taken from discharge papers, no frequency documented    [DISCONTINUED] aspirin EC 81 MG EC tablet Take 1 tablet (81 mg) by mouth daily    [DISCONTINUED] atorvastatin (LIPITOR) 80 MG tablet Take 1 tablet (80 mg) by mouth daily    [DISCONTINUED] DAPAGLIFLOZIN PROPANEDIOL PO Take by mouth Information taken from discharge documents, no frequency and dose stated    [DISCONTINUED] fexofenadine (ALLEGRA) 180 MG tablet Information taken from discharge papers, no frequency and dose stated    [DISCONTINUED] fluticasone (FLONASE) 50 MCG/ACT nasal spray 1 spray by Nasal route Information taken from discharge papers, no frequency stated    [DISCONTINUED] furosemide (LASIX) 40 MG tablet Take 1 tablet (40 mg) by mouth daily    [DISCONTINUED] levalbuterol (XOPENEX) 0.63 MG/3ML nebulizer solution Take 3 mLs (0.63 mg) by nebulization Information taken from discharge papers, no frequency stated    [DISCONTINUED] metoprolol succinate XL (TOPROL-XL) 50 MG 24 hr tablet Take 1 tablet (50 mg) by mouth daily    [DISCONTINUED] metoprolol succinate XL (TOPROL-XL) 50 MG 24 hr  tablet Take 1 tablet (50 mg) by mouth Metoprolol Succinate (KAPSPARGO SPRINKLE) '50mg'$  CS24 Capsule    Information taken from discharge papers, no frequency stated    [DISCONTINUED] montelukast (SINGULAIR) 10 MG tablet Take 1 tablet (10 mg) by mouth Information taken from discharge papers,no frequency stated    [DISCONTINUED] omeprazole (PriLOSEC) 20 MG capsule Take 1 capsule (20 mg) by mouth 2 (two) times daily    [DISCONTINUED] Sacubitril-Valsartan (ENTRESTO PO) Take by mouth 2 (two) times daily No dosage    [DISCONTINUED] sacubitril-valsartan (Entresto) 49-51 MG Tab per tablet Take 1 tablet by mouth 2  (two) times daily       Family History:     Family History   Problem Relation Age of Onset    Heart failure Mother     COPD Father        Social History:     Social History     Socioeconomic History    Marital status: Widowed     Spouse name: Not on file    Number of children: Not on file    Years of education: Not on file    Highest education level: Not on file   Occupational History    Not on file   Tobacco Use    Smoking status: Former     Packs/day: 1.00     Years: 20.00     Additional pack years: 0.00     Total pack years: 20.00     Types: Cigarettes     Quit date: 68     Years since quitting: 49.8    Smokeless tobacco: Never   Substance and Sexual Activity    Alcohol use: Not Currently     Comment: Patient denies any history of excessive alcohol intake    Drug use: Never    Sexual activity: Not on file   Other Topics Concern    Not on file   Social History Narrative    Not on file     Social Determinants of Health     Financial Resource Strain: Not on file   Food Insecurity: Not on file   Transportation Needs: Not on file   Physical Activity: Not on file   Stress: Not on file   Social Connections: Not on file   Intimate Partner Violence: Not on file   Housing Stability: Not on file       Review of Systems:   '[x]'$ General: weight: '[]'$ Stable '[]'$ Loss '[]'$  Gain '[x]'$ No Fever '[]'$ Chills '[]'$  Rigors    '[x]'$ Respiratory: '[x]'$ No Shortness of breath '[x]'$  No cough '[x]'$ No  wheeze '[x]'$  No hemoptysis    '[x]'$ Cardiac: '[x]'$  No Chest pain '[x]'$ No  palpitations '[]'$  Shortness of breath '[x]'$ No  PND '[x]'$  No orthopnea     '[x]'$ GI: '[x]'$ No abdominal pain '[x]'$  No nausea '[x]'$ No vomiting '[x]'$ No diarrhea '[]'$  constipation '[x]'$  No blood in stool  '[]'$ change in bowel habits    '[x]'$ Genito-urinary: '[x]'$ No dysuria '[x]'$ No frequency '[x]'$ No  urgency '[x]'$ No hematuria '[x]'$  Notrouble voiding '[]'$ Nocturia    '[x]'$ CNS: '[x]'$ No focal weakness '[x]'$ No ataxia '[x]'$ No seizures '[x]'$ Encephalopathy    '[x]'$ Endocrine: '[]'$  Fatigue '[x]'$ No polyuria '[x]'$  No polydipsia '[]'$ General weakness    '[x]'$ Skin: '[x]'$  No rash '[x]'$ No  pruritis '[]'$   Ecchymosis '[]'$  Pressure ulcer    '[x]'$ Musculoskeletal: '[x]'$ negative    Physical Exam:     Vitals:    04/15/22 0734 04/15/22 0821 04/15/22 0903 04/15/22 1152   BP: 125/76   127/81   Pulse: 69 74  77   Resp: 17   18  Temp: 97.9 F (36.6 C)   98.1 F (36.7 C)   TempSrc: Oral   Oral   SpO2: 98%  98% 100%   Weight:       Height:           Wt Readings from Last 4 Encounters:   04/15/22 67.9 kg (149 lb 11.1 oz)       Intake and Output Summary (Last 24 hours) at Date Time    Intake/Output Summary (Last 24 hours) at 04/15/2022 1241  Last data filed at 04/15/2022 1152  Gross per 24 hour   Intake 360 ml   Output 1300 ml   Net -940 ml           '[x]'$ General appearance: '[x]'$ No distress '[x]'$  Alert  '[x]'$  Well developed/well nourshied,        '[]'$ Obese   '[]'$  Cachexia       '[x]'$ HEENT: '[x]'$  Normocephalic '[x]'$ pupils reactive '[x]'$ EOMI '[x]'$ No pallor '[x]'$ No cyanosis                     '[x]'$ No icterus.     '[x]'$ Mouth:   '[x]'$  Mucous membranes moist      '[x]'$ Neck: '[x]'$  Supple  '[x]'$ No adenopathy  '[x]'$ No JVD   '[x]'$   NoThyromegaly '[x]'$  No carotid bruits     '[x]'$ Chest: '[x]'$ Clear to auscultation  '[x]'$  No Wheeze  '[x]'$  No rales   '[x]'$  No rhonchi                   '[x]'$  No Crackles        '[x]'$ Heart: '[x]'$ RRR  '[]'$  Irregular rhythm  '[x]'$ Normal S1, S2 '[]'$  No murmurs '[x]'$  No rubs      '[x]'$ Abdomen: '[x]'$  Soft  '[x]'$  Nontender   '[x]'$   Nondistended  '[x]'$  No masses                          '[x]'$  No Organomegaly   '[x]'$ No rebound rigidity or guarding     '[x]'$ Extremities:'[x]'$  No Peripheral pulses diminished  '[]'$   Pedal edema   '[x]'$  No cellulitis      '[x]'$ Musculoskeletal: '[x]'$  No joint tenderness '[x]'$  No deformity  '[x]'$ No swelling     '[x]'$ Neurological: '[x]'$ Alert '[]'$  Anxious '[]'$  Drowsy '[]'$ Confused '[]'$  Lethargic                               '[x]'$ No focal weakness  '[x]'$ No sensory deficit  '[x]'$ Normal CN's     '[x]'$ Skin: '[x]'$ Normal coloration and turgor '[x]'$  No rashes '[x]'$  No skin lesions noted    Labs:     Recent Labs   Lab 04/14/22  1330 04/13/22  0449 04/12/22  1723 04/12/22  0931 04/12/22  0558   Glucose 97 78 166*  332* 74   BUN 29.0* 40.0* 44.0* 45.0* 50.0*   Creatinine 1.2* 1.5* 1.6* 1.9* 1.9*   Calcium 7.9 8.6 8.8 8.4 8.9   Sodium 138 135 136 128* 128*   Potassium 4.5 4.7 5.1 5.4* 6.8*   Chloride 109 102 105 102 104   CO2 '20 21 20 17 '$ 13*   Albumin 2.6*  --   --  2.9* 3.3*   Phosphorus 2.9  --   --  3.2  --    Magnesium  --   --   --   --  1.6   eGFR 43.7* 33.5* 31.0* 25.2* 25.2*       Recent Labs   Lab 04/14/22  1330 04/12/22  0931 04/12/22  0558   AST (SGOT)  --   --  19   ALT  --   --  15   Alkaline Phosphatase  --   --  99   Albumin 2.6* 2.9* 3.3*   Bilirubin, Total  --   --  0.6       Recent Labs   Lab 04/13/22  0449 04/12/22  0558   WBC 7.06 6.31   Hgb 10.9* 11.5   Hematocrit 32.6* 35.7   MCV 87.2 89.0   MCH 29.1 28.7   MCHC 33.4 32.2   RDW 13 12   MPV 11.1 10.7   Platelets 188 188             Invalid input(s): "FREET4"          Invalid input(s): "LEUKOCYTESUR"          Rads:     Radiology Results (24 Hour)       ** No results found for the last 24 hours. **                Signed by: Dory Larsen, MD  04/15/22

## 2022-04-15 NOTE — Progress Notes (Signed)
Update as follows:    CM referred the Pt to [facility] for home health PT/OT. Cm will continue to follow.    Casey Burkitt, MSW, Supervisee in Social Work, Tourist information centre manager I

## 2023-09-19 ENCOUNTER — Other Ambulatory Visit (INDEPENDENT_AMBULATORY_CARE_PROVIDER_SITE_OTHER)

## 2023-09-19 DIAGNOSIS — N183 Chronic kidney disease, stage 3 unspecified: Secondary | ICD-10-CM

## 2023-09-19 DIAGNOSIS — R809 Proteinuria, unspecified: Secondary | ICD-10-CM

## 2023-09-19 DIAGNOSIS — E875 Hyperkalemia: Secondary | ICD-10-CM

## 2023-09-19 LAB — COMPREHENSIVE METABOLIC PANEL
ALT: <6 U/L (ref <=55)
AST (SGOT): 14 U/L (ref <=41)
Albumin/Globulin Ratio: 0.8 — ABNORMAL LOW (ref 0.9–2.2)
Albumin: 2.8 g/dL — ABNORMAL LOW (ref 3.5–5.0)
Alkaline Phosphatase: 124 U/L — ABNORMAL HIGH (ref 37–117)
Anion Gap: 12 (ref 5.0–15.0)
BUN: 15 mg/dL (ref 7–21)
Bilirubin, Total: 0.4 mg/dL (ref 0.2–1.2)
CO2: 24 meq/L (ref 17–29)
Calcium: 7.7 mg/dL — ABNORMAL LOW (ref 7.9–10.2)
Chloride: 103 meq/L (ref 99–111)
Creatinine: 1 mg/dL (ref 0.4–1.0)
GFR: 53.5 mL/min/{1.73_m2} — ABNORMAL LOW (ref >=60.0)
Globulin: 3.7 g/dL — ABNORMAL HIGH (ref 2.0–3.6)
Glucose: 87 mg/dL (ref 70–100)
Hemolysis Index: 3 {index}
Potassium: 3.3 meq/L — ABNORMAL LOW (ref 3.5–5.3)
Protein, Total: 6.5 g/dL (ref 6.0–8.3)
Sodium: 139 meq/L (ref 135–145)

## 2024-03-24 DEATH — deceased
